# Patient Record
Sex: Male | Born: 1970
Health system: Southern US, Community
[De-identification: ages and names within clinical notes are randomized; demographics above are authoritative.]

## PROBLEM LIST (undated history)

## (undated) DIAGNOSIS — I10 Essential (primary) hypertension: Secondary | ICD-10-CM

---

## 1999-09-03 ENCOUNTER — Encounter: Payer: Self-pay | Admitting: Emergency Medicine

## 1999-09-03 ENCOUNTER — Emergency Department (HOSPITAL_COMMUNITY): Admission: EM | Admit: 1999-09-03 | Discharge: 1999-09-03 | Payer: Self-pay | Admitting: Emergency Medicine

## 2008-08-13 ENCOUNTER — Emergency Department (HOSPITAL_BASED_OUTPATIENT_CLINIC_OR_DEPARTMENT_OTHER): Admission: EM | Admit: 2008-08-13 | Discharge: 2008-08-13 | Payer: Self-pay | Admitting: Emergency Medicine

## 2012-09-19 ENCOUNTER — Encounter (HOSPITAL_BASED_OUTPATIENT_CLINIC_OR_DEPARTMENT_OTHER): Payer: Self-pay | Admitting: *Deleted

## 2012-09-19 ENCOUNTER — Emergency Department (HOSPITAL_BASED_OUTPATIENT_CLINIC_OR_DEPARTMENT_OTHER)
Admission: EM | Admit: 2012-09-19 | Discharge: 2012-09-19 | Disposition: A | Discharge status: Home / Self Care | Payer: Self-pay | Attending: Emergency Medicine | Admitting: Emergency Medicine

## 2012-09-19 ENCOUNTER — Emergency Department (HOSPITAL_BASED_OUTPATIENT_CLINIC_OR_DEPARTMENT_OTHER): Payer: Self-pay

## 2012-09-19 DIAGNOSIS — F17200 Nicotine dependence, unspecified, uncomplicated: Secondary | ICD-10-CM | POA: Insufficient documentation

## 2012-09-19 DIAGNOSIS — R079 Chest pain, unspecified: Secondary | ICD-10-CM | POA: Insufficient documentation

## 2012-09-19 LAB — CBC
HCT: 40.7 % (ref 39.0–52.0)
MCV: 84.6 fL (ref 78.0–100.0)
Platelets: 225 10*3/uL (ref 150–400)
RBC: 4.81 MIL/uL (ref 4.22–5.81)
RDW: 13.6 % (ref 11.5–15.5)
WBC: 7.6 10*3/uL (ref 4.0–10.5)

## 2012-09-19 LAB — URINALYSIS, ROUTINE W REFLEX MICROSCOPIC
Bilirubin Urine: NEGATIVE
Glucose, UA: NEGATIVE mg/dL
Hgb urine dipstick: NEGATIVE
Ketones, ur: NEGATIVE mg/dL
Protein, ur: NEGATIVE mg/dL
Urobilinogen, UA: 0.2 mg/dL (ref 0.0–1.0)

## 2012-09-19 LAB — COMPREHENSIVE METABOLIC PANEL
AST: 23 U/L (ref 0–37)
Albumin: 3.9 g/dL (ref 3.5–5.2)
Alkaline Phosphatase: 55 U/L (ref 39–117)
BUN: 7 mg/dL (ref 6–23)
CO2: 26 mEq/L (ref 19–32)
Chloride: 102 mEq/L (ref 96–112)
Creatinine, Ser: 1 mg/dL (ref 0.50–1.35)
GFR calc non Af Amer: >90 mL/min (ref >90)
Potassium: 4 mEq/L (ref 3.5–5.1)
Total Bilirubin: 0.2 mg/dL — ABNORMAL LOW (ref 0.3–1.2)

## 2012-09-19 NOTE — ED Provider Notes (Signed)
History     CSN: [id number]  Arrival date & time 09/19/12  1106   First MD Initiated Contact with Patient 09/19/12 1151      Chief Complaint  Patient presents with  . Chest Pain    (Consider location/radiation/quality/duration/timing/severity/associated sxs/prior treatment) HPI Comments: Patient presents with a 2 1/2 month history of intermittent discomfort in the center of the chest.  It seems to come and go at random without any exertional component.  The is no radiation to the arm or jaw but he does report some numbness of the hands when the symptoms are at their worst.    Patient is a 41 y.o. male presenting with chest pain. The history is provided by the patient.  Chest Pain Episode onset: 2 1/2 months ago. Chest pain occurs intermittently. The chest pain is unchanged. The pain is associated with stress. The severity of the pain is moderate. The quality of the pain is described as aching. The pain does not radiate. Chest pain is worsened by stress.     History reviewed. No pertinent past medical history.  History reviewed. No pertinent past surgical history.  History reviewed. No pertinent family history.  History  Substance Use Topics  . Smoking status: Current Every Day Smoker -- 1.0 packs/day    Types: Cigarettes  . Smokeless tobacco: Not on file  . Alcohol Use: Yes     remote history of drug and ETOH abuse      Review of Systems  Cardiovascular: Positive for chest pain.  All other systems reviewed and are negative.    Allergies  Review of patient's allergies indicates no known allergies.  Home Medications  No current outpatient prescriptions on file.  BP 122/90  Pulse 79  Temp 98.3 F (36.8 C) (Oral)  Resp 14  SpO2 100%  Physical Exam  Nursing note and vitals reviewed. Constitutional: He is oriented to person, place, and time. He appears well-developed and well-nourished. No distress.  HENT:  Head: Normocephalic and atraumatic.  Mouth/Throat:  Oropharynx is clear and moist.  Neck: Normal range of motion. Neck supple.  Cardiovascular: Normal rate and regular rhythm.   No murmur heard. Pulmonary/Chest: Effort normal and breath sounds normal. No respiratory distress.  Abdominal: Soft. Bowel sounds are normal. He exhibits no distension. There is no tenderness.  Musculoskeletal: Normal range of motion.  Neurological: He is alert and oriented to person, place, and time.  Skin: Skin is warm and dry. He is not diaphoretic.    ED Course  Procedures (including critical care time)   Labs Reviewed  CBC  COMPREHENSIVE METABOLIC PANEL  URINALYSIS, ROUTINE W REFLEX MICROSCOPIC   No results found.   No diagnosis found.   Date: 09/19/2012  Rate: 75  Rhythm: normal sinus rhythm  QRS Axis: normal  Intervals: normal  ST/T Wave abnormalities: nonspecific T wave changes  Conduction Disutrbances:none  Narrative Interpretation:   Old EKG Reviewed: none available    MDM  The symptoms are atypical for a cardiac etiology and the workup is negative.  I will discharge him with follow up information for [facility] to discuss possible stress test.        Geoffery Lyons, MD 09/19/12 1309

## 2012-09-19 NOTE — ED Notes (Signed)
Patient C/O chest pain that has been intermittent for more than a year. States that to day the pain came and has not gone away.  Pain is mid chest with pain in his neck and left shoulder.  Also C?O left hand numbness.  He describes that pain as pressure and is 6/10.  Pressing on his sternum makes the pain a little worse.

## 2014-08-22 ENCOUNTER — Encounter (HOSPITAL_BASED_OUTPATIENT_CLINIC_OR_DEPARTMENT_OTHER): Payer: Self-pay | Admitting: Emergency Medicine

## 2014-08-22 ENCOUNTER — Emergency Department (HOSPITAL_BASED_OUTPATIENT_CLINIC_OR_DEPARTMENT_OTHER)
Admission: EM | Admit: 2014-08-22 | Discharge: 2014-08-22 | Disposition: A | Discharge status: Home / Self Care | Payer: Self-pay | Attending: Emergency Medicine | Admitting: Emergency Medicine

## 2014-08-22 DIAGNOSIS — M5430 Sciatica, unspecified side: Secondary | ICD-10-CM | POA: Insufficient documentation

## 2014-08-22 DIAGNOSIS — M5441 Lumbago with sciatica, right side: Secondary | ICD-10-CM

## 2014-08-22 DIAGNOSIS — F17200 Nicotine dependence, unspecified, uncomplicated: Secondary | ICD-10-CM | POA: Insufficient documentation

## 2014-08-22 DIAGNOSIS — M549 Dorsalgia, unspecified: Secondary | ICD-10-CM | POA: Insufficient documentation

## 2014-08-22 MED ORDER — KETOROLAC TROMETHAMINE 60 MG/2ML IM SOLN
60.0000 mg | Freq: Once | INTRAMUSCULAR | Status: AC
  Administered 2014-08-22: 60 mg via INTRAMUSCULAR
  Filled 2014-08-22: qty 2

## 2014-08-22 MED ORDER — OXYCODONE-ACETAMINOPHEN 5-325 MG PO TABS
1.0000 | ORAL_TABLET | Freq: Once | ORAL | Status: AC
  Administered 2014-08-22: 1 via ORAL
  Filled 2014-08-22: qty 1

## 2014-08-22 MED ORDER — NAPROXEN 500 MG PO TABS: 500.0000 mg | ORAL_TABLET | Freq: Two times a day (BID) | ORAL | Status: DC

## 2014-08-22 MED ORDER — CYCLOBENZAPRINE HCL 10 MG PO TABS
10.0000 mg | ORAL_TABLET | Freq: Once | ORAL | Status: AC
  Administered 2014-08-22: 10 mg via ORAL
  Filled 2014-08-22: qty 1

## 2014-08-22 MED ORDER — HYDROCODONE-ACETAMINOPHEN 5-325 MG PO TABS: 1.0000 | ORAL_TABLET | Freq: Four times a day (QID) | ORAL | Status: DC | PRN

## 2014-08-22 MED ORDER — CYCLOBENZAPRINE HCL 10 MG PO TABS: 10.0000 mg | ORAL_TABLET | Freq: Two times a day (BID) | ORAL | Status: DC | PRN

## 2014-08-22 NOTE — ED Notes (Addendum)
Back and right hip pain. Pain started 3 days ago after picking up a child.

## 2014-08-22 NOTE — ED Provider Notes (Signed)
CSN: [id number]     Arrival date & time 08/22/14  1714 History   First MD Initiated Contact with Patient 08/22/14 1724     Chief Complaint  Patient presents with  . Back Pain     (Consider location/radiation/quality/duration/timing/severity/associated sxs/prior Treatment) Patient is a [age] y.o. male presenting with back pain. The history is provided by the patient.  Back Pain Location:  Lumbar spine Quality:  Stabbing and burning Radiates to:  R posterior upper leg Pain severity:  Severe Pain is:  Same all the time Onset quality:  Gradual Duration:  4 days Timing:  Constant Progression:  Worsening Chronicity:  Recurrent Context: physical stress   Relieved by:  Nothing Worsened by:  Movement and bending Ineffective treatments:  Cold packs, heating pad and ibuprofen Associated symptoms: leg pain   Associated symptoms: no abdominal pain, no bladder incontinence, no bowel incontinence, no chest pain, no dysuria and no fever    Michael Lowery is a 43 y.o. male who presents to the ED with low back pain that started 4 days ago. The pain increases with any movement. He has alternated heat and cold and has taken ibuprofen without relief. The pain started 3 days ago after his lifted his child. The pain radiates to the right buttock.   History reviewed. No pertinent past medical history. History reviewed. No pertinent past surgical history. No family history on file. History  Substance Use Topics  . Smoking status: Current Every Day Smoker -- 1.00 packs/day    Types: Cigarettes  . Smokeless tobacco: Not on file  . Alcohol Use: Yes     Comment: remote history of drug and ETOH abuse    Review of Systems  Constitutional: Negative for fever.  Cardiovascular: Negative for chest pain.  Gastrointestinal: Negative for abdominal pain and bowel incontinence.  Genitourinary: Negative for bladder incontinence and dysuria.  Musculoskeletal: Positive for back pain.  all other systems  negative    Allergies  Review of patient's allergies indicates no known allergies.  Home Medications   Prior to Admission medications   Not on File   BP 130/64  Pulse 95  Temp(Src) 98.5 F (36.9 C) (Oral)  Resp 20  Ht 5\' 9"  (1.753 m)  Wt 165 lb (74.844 kg)  BMI 24.36 kg/m2  SpO2 99% Physical Exam  Nursing note and vitals reviewed. Constitutional: He is oriented to person, place, and time. He appears well-developed and well-nourished. No distress.  HENT:  Head: Normocephalic and atraumatic.  Eyes: EOM are normal. Pupils are equal, round, and reactive to light.  Neck: Normal range of motion. Neck supple.  Cardiovascular: Normal rate and regular rhythm.   Pulmonary/Chest: Effort normal. No respiratory distress. He has no wheezes. He has no rales.  Abdominal: Soft. Bowel sounds are normal. There is no tenderness.  Musculoskeletal: Normal range of motion. He exhibits no edema.       Lumbar back: He exhibits tenderness and spasm. He exhibits normal range of motion, no deformity and normal pulse.       Back:  Neurological: He is alert and oriented to person, place, and time. He has normal strength. No cranial nerve deficit or sensory deficit. Coordination and gait normal.  Reflex Scores:      Bicep reflexes are 2+ on the right side and 2+ on the left side.      Brachioradialis reflexes are 2+ on the right side and 2+ on the left side.      Patellar reflexes are 2+ on the  right side and 2+ on the left side.      Achilles reflexes are 2+ on the right side and 2+ on the left side. Skin: Skin is warm and dry.  Psychiatric: He has a normal mood and affect. His behavior is normal.    ED Course  Procedures   MDM  43 y.o. male with low back pain after lifting a child 3 days ago. Will treat for lumbar strain. Stable for discharge without neuro deficits. Discussed with the patient clinical findings and plan of care and all questioned fully answered. He will return if any problems  arise.    Medication List         cyclobenzaprine 10 MG tablet  Commonly known as:  FLEXERIL  Take 1 tablet (10 mg total) by mouth 2 (two) times daily as needed for muscle spasms.     HYDROcodone-acetaminophen 5-325 MG per tablet  Commonly known as:  NORCO  Take 1 tablet by mouth every 6 (six) hours as needed for moderate pain.     naproxen 500 MG tablet  Commonly known as:  NAPROSYN  Take 1 tablet (500 mg total) by mouth 2 (two) times daily.             [place], [place] 08/23/14 [phone]

## 2014-08-24 NOTE — ED Provider Notes (Signed)
Medical screening examination/treatment/procedure(s) were performed by non-physician practitioner and as supervising physician I was immediately available for consultation/collaboration.   EKG Interpretation None        Audree CamelScott T Julies Carmickle, MD 08/24/14 [phone]

## 2015-05-16 ENCOUNTER — Encounter (HOSPITAL_BASED_OUTPATIENT_CLINIC_OR_DEPARTMENT_OTHER): Payer: Self-pay

## 2015-05-16 ENCOUNTER — Emergency Department (HOSPITAL_BASED_OUTPATIENT_CLINIC_OR_DEPARTMENT_OTHER)
Admission: EM | Admit: 2015-05-16 | Discharge: 2015-05-16 | Discharge status: Against Medical Advice | Payer: Self-pay | Attending: Emergency Medicine | Admitting: Emergency Medicine

## 2015-05-16 ENCOUNTER — Emergency Department (HOSPITAL_BASED_OUTPATIENT_CLINIC_OR_DEPARTMENT_OTHER)
Admission: EM | Admit: 2015-05-16 | Discharge: 2015-05-16 | Disposition: A | Discharge status: Home / Self Care | Payer: Self-pay | Attending: Emergency Medicine | Admitting: Emergency Medicine

## 2015-05-16 DIAGNOSIS — K047 Periapical abscess without sinus: Secondary | ICD-10-CM | POA: Insufficient documentation

## 2015-05-16 DIAGNOSIS — R220 Localized swelling, mass and lump, head: Secondary | ICD-10-CM | POA: Insufficient documentation

## 2015-05-16 DIAGNOSIS — Z720 Tobacco use: Secondary | ICD-10-CM | POA: Insufficient documentation

## 2015-05-16 MED ORDER — CLINDAMYCIN HCL 300 MG PO CAPS: 300.0000 mg | ORAL_CAPSULE | Freq: Four times a day (QID) | ORAL | Status: DC

## 2015-05-16 NOTE — ED Notes (Signed)
Pt on phone-advised pt to let me know when he can end the call and proceed with triage-pt back to ED WR

## 2015-05-16 NOTE — Discharge Instructions (Signed)
Take clindamycin as directed for 1 week. It is important to complete the entire course of the antibiotic. Follow-up with the dentist listed above were one of the resources attached.  Dental Abscess A dental abscess is a collection of infected fluid (pus) from a bacterial infection in the inner part of the tooth (pulp). It usually occurs at the end of the tooth's root.  CAUSES   Severe tooth decay.  Trauma to the tooth that allows bacteria to enter into the pulp, such as a broken or chipped tooth. SYMPTOMS   Severe pain in and around the infected tooth.  Swelling and redness around the abscessed tooth or in the mouth or face.  Tenderness.  Pus drainage.  Bad breath.  Bitter taste in the mouth.  Difficulty swallowing.  Difficulty opening the mouth.  Nausea.  Vomiting.  Chills.  Swollen neck glands. DIAGNOSIS   A medical and dental history will be taken.  An examination will be performed by tapping on the abscessed tooth.  X-rays may be taken of the tooth to identify the abscess. TREATMENT The goal of treatment is to eliminate the infection. You may be prescribed antibiotic medicine to stop the infection from spreading. A root canal may be performed to save the tooth. If the tooth cannot be saved, it may be pulled (extracted) and the abscess may be drained.  HOME CARE INSTRUCTIONS  Only take over-the-counter or prescription medicines for pain, fever, or discomfort as directed by your caregiver.  Rinse your mouth (gargle) often with salt water ( tsp salt in 8 oz [250 ml] of warm water) to relieve pain or swelling.  Do not drive after taking pain medicine (narcotics).  Do not apply heat to the outside of your face.  Return to your dentist for further treatment as directed. SEEK MEDICAL CARE IF:  Your pain is not helped by medicine.  Your pain is getting worse instead of better. SEEK IMMEDIATE MEDICAL CARE IF:  You have a fever or persistent symptoms for more  than 2-3 days.  You have a fever and your symptoms suddenly get worse.  You have chills or a very bad headache.  You have problems breathing or swallowing.  You have trouble opening your mouth.  You have swelling in the neck or around the eye. Document Released: 11/10/2005 Document Revised: 08/04/2012 Document Reviewed: 02/18/2011 [facility] Patient Information 2015 [place], [place]. This information is not intended to replace advice given to you by your health care provider. Make sure you discuss any questions you have with your health care provider.

## 2015-05-16 NOTE — ED Notes (Addendum)
Swelling to left cheek-started last night-pt states he known he has "bad tooth" left lower x "months"-pt was seen by [facility] MD r/t to possible injury yesterday at work yesterday and referred to ED-pt states he used an OTC product "like silly putty that you put down in the tooth" yesterday
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
## 2015-05-16 NOTE — ED Notes (Signed)
Pt appeared to be on the phone with employer questioning whether his swelling was r/t injury at work yesterday vs known "bad tooth"-now advised by registration clerk that pt notified her he was leaving due to his work advised him to be seen at another facility

## 2016-02-20 ENCOUNTER — Encounter (HOSPITAL_BASED_OUTPATIENT_CLINIC_OR_DEPARTMENT_OTHER): Payer: Self-pay | Admitting: Emergency Medicine

## 2016-02-20 ENCOUNTER — Emergency Department (HOSPITAL_BASED_OUTPATIENT_CLINIC_OR_DEPARTMENT_OTHER): Payer: Self-pay

## 2016-02-20 ENCOUNTER — Emergency Department (HOSPITAL_BASED_OUTPATIENT_CLINIC_OR_DEPARTMENT_OTHER)
Admission: EM | Admit: 2016-02-20 | Discharge: 2016-02-20 | Disposition: A | Discharge status: Home / Self Care | Payer: Self-pay | Attending: Emergency Medicine | Admitting: Emergency Medicine

## 2016-02-20 DIAGNOSIS — F17210 Nicotine dependence, cigarettes, uncomplicated: Secondary | ICD-10-CM | POA: Insufficient documentation

## 2016-02-20 DIAGNOSIS — R69 Illness, unspecified: Secondary | ICD-10-CM

## 2016-02-20 DIAGNOSIS — J111 Influenza due to unidentified influenza virus with other respiratory manifestations: Secondary | ICD-10-CM | POA: Insufficient documentation

## 2016-02-20 MED ORDER — ACETAMINOPHEN 325 MG PO TABS
650.0000 mg | ORAL_TABLET | Freq: Once | ORAL | Status: AC | PRN
  Administered 2016-02-20: 650 mg via ORAL
  Filled 2016-02-20: qty 2
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
## 2016-02-20 NOTE — Discharge Instructions (Signed)
Influenza, Adult Take Tylenol as directed every 4 hours while awake for aches or for temperature higher than 100.4. Make sure that you drink at least six 8 ounce glasses of water or Gatorade each day. Call the [facility] and community wellness Center or your local health department to get a primary care physician. See your physician in the office if not improving by next week. Ask your new primary care physician to help you to stop smoking .Return if concerned for any reason. Influenza ("the flu") is a viral infection of the respiratory tract. It occurs more often in winter months because people spend more time in close contact with one another. Influenza can make you feel very sick. Influenza easily spreads from person to person (contagious). CAUSES  Influenza is caused by a virus that infects the respiratory tract. You can catch the virus by breathing in droplets from an infected person's cough or sneeze. You can also catch the virus by touching something that was recently contaminated with the virus and then touching your mouth, nose, or eyes. RISKS AND COMPLICATIONS You may be at risk for a more severe case of influenza if you smoke cigarettes, have diabetes, have chronic heart disease (such as heart failure) or lung disease (such as asthma), or if you have a weakened immune system. Elderly people and pregnant women are also at risk for more serious infections. The most common problem of influenza is a lung infection (pneumonia). Sometimes, this problem can require emergency medical care and may be life threatening. SIGNS AND SYMPTOMS  Symptoms typically last 4 to 10 days and may include:  Fever.  Chills.  Headache, body aches, and muscle aches.  Sore throat.  Chest discomfort and cough.  Poor appetite.  Weakness or feeling tired.  Dizziness.  Nausea or vomiting. DIAGNOSIS  Diagnosis of influenza is often made based on your history and a physical exam. A nose or throat swab test can  be done to confirm the diagnosis. TREATMENT  In mild cases, influenza goes away on its own. Treatment is directed at relieving symptoms. For more severe cases, your health care provider may prescribe antiviral medicines to shorten the sickness. Antibiotic medicines are not effective because the infection is caused by a virus, not by bacteria. HOME CARE INSTRUCTIONS  Take medicines only as directed by your health care provider.  Use a cool mist humidifier to make breathing easier.  Get plenty of rest until your temperature returns to normal. This usually takes 3 to 4 days.  Drink enough fluid to keep your urine clear or pale yellow.  Cover yourmouth and nosewhen coughing or sneezing,and wash your handswellto prevent thevirusfrom spreading.  Stay homefromwork orschool untilthe fever is gonefor at least 27full day. PREVENTION  An annual influenza vaccination (flu shot) is the best way to avoid getting influenza. An annual flu shot is now routinely recommended for all adults in the [place] SEEK MEDICAL CARE IF:  You experiencechest pain, yourcough worsens,or you producemore mucus.  Youhave nausea,vomiting, ordiarrhea.  Your fever returns or gets worse. SEEK IMMEDIATE MEDICAL CARE IF:  You havetrouble breathing, you become short of breath,or your skin ornails becomebluish.  You have severe painor stiffnessin the neck.  You develop a sudden headache, or pain in the face or ear.  You have nausea or vomiting that you cannot control. MAKE SURE YOU:   Understand these instructions.  Will watch your condition.  Will get help right away if you are not doing well or get worse.  This information is not intended to replace advice given to you by your health care provider. Make sure you discuss any questions you have with your health care provider.   Document Released: 11/07/2000 Document Revised: 12/01/2014 Document Reviewed: 02/09/2012 Elsevier Interactive  Patient Education [employer]2016 Elsevier Inc.

## 2016-02-20 NOTE — ED Notes (Signed)
Patient reports that he has had a cough x 2 weeks. Today he started to feel hot and then cold, generalized weakness and fever.

## 2016-02-20 NOTE — ED Notes (Signed)
Pt given d/c instructions as per chart. Verbalizes understanding. No questions. 

## 2016-02-20 NOTE — ED Notes (Signed)
MD at bedside. 

## 2016-02-20 NOTE — ED Notes (Signed)
Pt c/o H/A, body aches, cough since Sunday

## 2017-06-08 ENCOUNTER — Emergency Department (HOSPITAL_BASED_OUTPATIENT_CLINIC_OR_DEPARTMENT_OTHER)
Admission: EM | Admit: 2017-06-08 | Discharge: 2017-06-08 | Disposition: A | Discharge status: Home / Self Care | Payer: BLUE CROSS/BLUE SHIELD | Attending: Emergency Medicine | Admitting: Emergency Medicine

## 2017-06-08 ENCOUNTER — Encounter (HOSPITAL_BASED_OUTPATIENT_CLINIC_OR_DEPARTMENT_OTHER): Payer: Self-pay | Admitting: *Deleted

## 2017-06-08 DIAGNOSIS — S0003XA Contusion of scalp, initial encounter: Secondary | ICD-10-CM

## 2017-06-08 DIAGNOSIS — F17210 Nicotine dependence, cigarettes, uncomplicated: Secondary | ICD-10-CM | POA: Insufficient documentation

## 2017-06-08 DIAGNOSIS — K921 Melena: Secondary | ICD-10-CM

## 2017-06-08 DIAGNOSIS — R42 Dizziness and giddiness: Secondary | ICD-10-CM | POA: Diagnosis not present

## 2017-06-08 DIAGNOSIS — K625 Hemorrhage of anus and rectum: Secondary | ICD-10-CM | POA: Diagnosis not present

## 2017-06-08 LAB — COMPREHENSIVE METABOLIC PANEL
ALK PHOS: 55 U/L (ref 38–126)
ALT: 23 U/L (ref 17–63)
ANION GAP: 9 (ref 5–15)
AST: 21 U/L (ref 15–41)
Albumin: 4.3 g/dL (ref 3.5–5.0)
BUN: 8 mg/dL (ref 6–20)
CALCIUM: 9.5 mg/dL (ref 8.9–10.3)
CHLORIDE: 103 mmol/L (ref 101–111)
CO2: 27 mmol/L (ref 22–32)
CREATININE: 1.06 mg/dL (ref 0.61–1.24)
GFR calc Af Amer: >60 mL/min (ref >60)
Glucose, Bld: 99 mg/dL (ref 65–99)
Potassium: 4.2 mmol/L (ref 3.5–5.1)
SODIUM: 139 mmol/L (ref 135–145)
Total Bilirubin: 0.4 mg/dL (ref 0.3–1.2)
Total Protein: 7.7 g/dL (ref 6.5–8.1)

## 2017-06-08 LAB — CBC
HCT: 42.9 % (ref 39.0–52.0)
HEMOGLOBIN: 15 g/dL (ref 13.0–17.0)
MCH: 29.9 pg (ref 26.0–34.0)
MCHC: 35 g/dL (ref 30.0–36.0)
MCV: 85.6 fL (ref 78.0–100.0)
PLATELETS: 228 10*3/uL (ref 150–400)
RBC: 5.01 MIL/uL (ref 4.22–5.81)
RDW: 14.2 % (ref 11.5–15.5)
WBC: 8.1 10*3/uL (ref 4.0–10.5)

## 2017-06-08 NOTE — ED Triage Notes (Signed)
Blood in his stool this am. Hx of constipation. At work he was dizzy and staggered hitting his head on his work station. 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

## 2017-06-08 NOTE — Discharge Instructions (Signed)
It was our pleasure to provide your ER care today - we hope that you feel better.  Your blood tests negative for blood.   Your lab work is normal.   Sometimes when you wipe you can see some blood from small anal/rectal fissure.   Rest. Drink plenty of fluids.   Follow up with primary care doctor in the coming week if symptoms recur.    Return to ER if worse, new symptoms, fevers, heavy rectal bleeding, fainting, other concern.

## 2017-06-08 NOTE — ED Provider Notes (Addendum)
[facility] Provider Note   CSN: [id number] Arrival date & time: 06/08/17  1447   By signing my name below, I, Soijett Blue, attest that this documentation has been prepared under the direction and in the presence of Cathren Laine, MD. Electronically Signed: Soijett Blue, ED Scribe. 06/08/17. 5:07 PM.  History   Chief Complaint Chief Complaint  Patient presents with  . Rectal Bleeding    HPI Michael Lowery is a 46 y.o. male who presents to the Emergency Department complaining of rectal bleeding onset today. He states that he noticed the blood when he wiped after using the restroom today. Pt reports associated dark-colored stools, lightheadedness x yesterday, blurred vision, dizziness, and hitting his head. He notes that he got dizzy when he accidentally struck the top of his head on a work station. Pt has not tried any medications for the relief of his symptoms. Denies taking anticoagulants or hx of GI ulcers. He denies LOC, vomiting, constipation, hx of hemorrhoids, and any other symptoms.    The history is provided by the patient. No language interpreter was used.    History reviewed. No pertinent past medical history.  There are no active problems to display for this patient.   History reviewed. No pertinent surgical history.     Home Medications    Prior to Admission medications   Medication Sig Start Date End Date Taking? Authorizing Provider  clindamycin (CLEOCIN) 300 MG capsule Take 1 capsule (300 mg total) by mouth 4 (four) times daily. X 7 days 05/16/15   Kathrynn Speed, PA-C    Family History No family history on file.  Social History Social History  Substance Use Topics  . Smoking status: Current Every Day Smoker    Packs/day: 1.00    Types: Cigarettes  . Smokeless tobacco: Never Used  . Alcohol use No     Allergies   Patient has no known allergies.   Review of Systems Review of Systems  Gastrointestinal: Positive for blood in stool  and hematochezia. Negative for constipation and vomiting.  Neurological: Positive for dizziness and light-headedness. Negative for syncope.     Physical Exam Updated Vital Signs BP (!) 143/93 (BP Location: Left Arm)   Pulse 82   Temp 98.7 F (37.1 C) (Oral)   Resp 18   Ht 5\' 9"  (1.753 m)   Wt 175 lb (79.4 kg)   SpO2 100%   BMI 25.84 kg/m   Physical Exam  Constitutional: He is oriented to person, place, and time. He appears well-developed and well-nourished. No distress.  HENT:  Head: Normocephalic and atraumatic.  Mouth/Throat: Oropharynx is clear and moist.  Eyes: Pupils are equal, round, and reactive to light. EOM are normal.  Neck: Neck supple. No thyromegaly present.  Cardiovascular: Normal rate, regular rhythm, normal heart sounds and intact distal pulses.  Exam reveals no gallop and no friction rub.   No murmur heard. Pulmonary/Chest: Effort normal and breath sounds normal. No respiratory distress. He has no wheezes. He has no rales.  Abdominal: Soft. He exhibits no distension and no mass. There is no tenderness. There is no rebound and no guarding. No hernia.  Genitourinary: Rectum normal. Rectal exam shows no external hemorrhoid and no internal hemorrhoid.  Genitourinary Comments: Scribe chaperone present for exam. Brown stool in vault. No gross blood noted.  Musculoskeletal: Normal range of motion.  c spine non tender, aligned.   Neurological: He is alert and oriented to person, place, and time.  Motor intact bil, stre  5/5. Steady gait.   Skin: Skin is warm and dry.  Psychiatric: He has a normal mood and affect. His behavior is normal.  Nursing note and vitals reviewed.    ED Treatments / Results  DIAGNOSTIC STUDIES: Oxygen Saturation is 100% on RA, nl by my interpretation.    COORDINATION OF CARE: 5:06 PM Discussed treatment plan with pt at bedside and pt agreed to plan.   Labs (all labs ordered are listed, but only abnormal results are displayed) Results  for orders placed or performed during the hospital encounter of 06/08/17  Comprehensive metabolic panel  Result Value Ref Range   Sodium 139 135 - 145 mmol/L   Potassium 4.2 3.5 - 5.1 mmol/L   Chloride 103 101 - 111 mmol/L   CO2 27 22 - 32 mmol/L   Glucose, Bld 99 65 - 99 mg/dL   BUN 8 6 - 20 mg/dL   Creatinine, Ser 1.611.06 0.61 - 1.24 mg/dL   Calcium 9.5 8.9 - 09.610.3 mg/dL   Total Protein 7.7 6.5 - 8.1 g/dL   Albumin 4.3 3.5 - 5.0 g/dL   AST 21 15 - 41 U/L   ALT 23 17 - 63 U/L   Alkaline Phosphatase 55 38 - 126 U/L   Total Bilirubin 0.4 0.3 - 1.2 mg/dL   GFR calc non Af Amer >60 >60 mL/min   GFR calc Af Amer >60 >60 mL/min   Anion gap 9 5 - 15  CBC  Result Value Ref Range   WBC 8.1 4.0 - 10.5 K/uL   RBC 5.01 4.22 - 5.81 MIL/uL   Hemoglobin 15.0 13.0 - 17.0 g/dL   HCT 04.542.9 40.939.0 - 81.152.0 %   MCV 85.6 78.0 - 100.0 fL   MCH 29.9 26.0 - 34.0 pg   MCHC 35.0 30.0 - 36.0 g/dL   RDW 91.414.2 78.211.5 - 95.615.5 %   Platelets 228 150 - 400 K/uL    EKG  EKG Interpretation None       Radiology No results found.  Procedures Procedures (including critical care time)  Medications Ordered in ED Medications - No data to display   Initial Impression / Assessment and Plan / ED Course  I have reviewed the triage vital signs and the nursing notes.  Pertinent labs & imaging results that were available during my care of the patient were reviewed by me and considered in my medical decision making (see chart for details).  Labs sent.  Rectal exam negative, lightbrown/yellow stool.  Pt had noted small amt blood on toilet paper when wiped, no other rectal bleeding, no melena.  ?tiny anal fissure.   No abd pain currently. abd soft nt.   Labs normal.  Patient appears stable for d/c.     Final Clinical Impressions(s) / ED Diagnoses   Final diagnoses:  None    New Prescriptions New Prescriptions   No medications on file  I personally performed the services described in this documentation,  which was scribed in my presence. The recorded information has been reviewed and considered. Cathren LaineSteinl, Maleyah Evans, MD       Cathren LaineSteinl, Blen Ransome, MD 06/08/17 [phone]

## 2018-05-25 ENCOUNTER — Emergency Department (HOSPITAL_BASED_OUTPATIENT_CLINIC_OR_DEPARTMENT_OTHER)
Admission: EM | Admit: 2018-05-25 | Discharge: 2018-05-25 | Disposition: A | Discharge status: Home / Self Care | Payer: BLUE CROSS/BLUE SHIELD | Attending: Emergency Medicine | Admitting: Emergency Medicine

## 2018-05-25 ENCOUNTER — Encounter (HOSPITAL_BASED_OUTPATIENT_CLINIC_OR_DEPARTMENT_OTHER): Payer: Self-pay | Admitting: *Deleted

## 2018-05-25 ENCOUNTER — Other Ambulatory Visit: Payer: Self-pay

## 2018-05-25 DIAGNOSIS — M25512 Pain in left shoulder: Secondary | ICD-10-CM | POA: Diagnosis not present

## 2018-05-25 DIAGNOSIS — R42 Dizziness and giddiness: Secondary | ICD-10-CM | POA: Insufficient documentation

## 2018-05-25 DIAGNOSIS — R8290 Unspecified abnormal findings in urine: Secondary | ICD-10-CM | POA: Diagnosis not present

## 2018-05-25 DIAGNOSIS — M25511 Pain in right shoulder: Secondary | ICD-10-CM | POA: Insufficient documentation

## 2018-05-25 DIAGNOSIS — M791 Myalgia, unspecified site: Secondary | ICD-10-CM | POA: Diagnosis present

## 2018-05-25 DIAGNOSIS — M25552 Pain in left hip: Secondary | ICD-10-CM | POA: Diagnosis not present

## 2018-05-25 DIAGNOSIS — F17210 Nicotine dependence, cigarettes, uncomplicated: Secondary | ICD-10-CM | POA: Diagnosis not present

## 2018-05-25 DIAGNOSIS — M25551 Pain in right hip: Secondary | ICD-10-CM | POA: Diagnosis not present

## 2018-05-25 LAB — COMPREHENSIVE METABOLIC PANEL
ALBUMIN: 4.2 g/dL (ref 3.5–5.0)
ALK PHOS: 48 U/L (ref 38–126)
ALT: 20 U/L (ref 0–44)
ANION GAP: 8 (ref 5–15)
AST: 28 U/L (ref 15–41)
BUN: 9 mg/dL (ref 6–20)
CALCIUM: 9 mg/dL (ref 8.9–10.3)
CHLORIDE: 106 mmol/L (ref 98–111)
CO2: 25 mmol/L (ref 22–32)
Creatinine, Ser: 0.95 mg/dL (ref 0.61–1.24)
GFR calc non Af Amer: >60 mL/min (ref >60)
GLUCOSE: 87 mg/dL (ref 70–99)
POTASSIUM: 4.3 mmol/L (ref 3.5–5.1)
SODIUM: 139 mmol/L (ref 135–145)
Total Bilirubin: 0.5 mg/dL (ref 0.3–1.2)
Total Protein: 7.3 g/dL (ref 6.5–8.1)

## 2018-05-25 LAB — CBC WITH DIFFERENTIAL/PLATELET
BASOS PCT: 0 %
Basophils Absolute: 0 10*3/uL (ref 0.0–0.1)
Eosinophils Absolute: 0.3 10*3/uL (ref 0.0–0.7)
Eosinophils Relative: 5 %
HEMATOCRIT: 42.7 % (ref 39.0–52.0)
HEMOGLOBIN: 15 g/dL (ref 13.0–17.0)
LYMPHS ABS: 2.5 10*3/uL (ref 0.7–4.0)
LYMPHS PCT: 37 %
MCH: 29.9 pg (ref 26.0–34.0)
MCHC: 35.1 g/dL (ref 30.0–36.0)
MCV: 85.2 fL (ref 78.0–100.0)
MONO ABS: 0.9 10*3/uL (ref 0.1–1.0)
MONOS PCT: 13 %
NEUTROS PCT: 45 %
Neutro Abs: 3 10*3/uL (ref 1.7–7.7)
Platelets: 224 10*3/uL (ref 150–400)
RBC: 5.01 MIL/uL (ref 4.22–5.81)
RDW: 13.5 % (ref 11.5–15.5)
WBC: 6.7 10*3/uL (ref 4.0–10.5)

## 2018-05-25 LAB — TROPONIN I: Troponin I: <0.03 ng/mL (ref <0.03)

## 2018-05-25 LAB — CK: Total CK: 846 U/L — ABNORMAL HIGH (ref 49–397)

## 2018-05-25 MED ORDER — KETOROLAC TROMETHAMINE 15 MG/ML IJ SOLN
15.0000 mg | Freq: Once | INTRAMUSCULAR | Status: AC
  Administered 2018-05-25: 15 mg via INTRAVENOUS
  Filled 2018-05-25: qty 1

## 2018-05-25 MED ORDER — MELOXICAM 15 MG PO TABS: 7.5000 mg | ORAL_TABLET | Freq: Every day | ORAL | 0 refills | Status: DC

## 2018-05-25 MED FILL — MELOXICAM 15 MG TABLET: 15 | 14 days supply | Qty: 14 | Fill #0

## 2018-05-25 NOTE — ED Provider Notes (Signed)
[facility] EMERGENCY DEPARTMENT Provider Note   CSN: [id number] Arrival date & time: 05/25/18  [id number]     History   Chief Complaint Chief Complaint  Patient presents with  . Muscle Pain    HPI Michael Lowery is a 47 y.o. male.  The history is provided by the patient. No language interpreter was used.  Muscle Pain    Michael Lowery is a 47 y.o. male who presents to the Emergency Department complaining of body aches. He reports one month of muscular aching pain and bilateral shoulders as well as bilateral hips. Pain is worse at night and improves a little bit with activity at work. He describes pain as a muscle type pain with a punching sensation. He is been treating himself with ibuprofen at home with minimal improvement in his symptoms. He is physically active for work with heavy lifting. He did have an episode of central chest pain over the last month, currently resolved. He does endorse a darker urine. He denies any fevers, night sweats, weight loss, shortness of breath, nausea, vomiting, diarrhea. He does get dizzy at times when he stands. He has no known medical problems. He does smoke cigarettes. Symptoms are moderate and constant in nature. History reviewed. No pertinent past medical history.  There are no active problems to display for this patient.   History reviewed. No pertinent surgical history.      Home Medications    Prior to Admission medications   Medication Sig Start Date End Date Taking? Authorizing Provider  meloxicam (MOBIC) 15 MG tablet Take 0.5-1 tablets (7.5-15 mg total) by mouth daily. 05/25/18   Tilden Fossaees, Genesis Paget, MD    Family History History reviewed. No pertinent family history.  Social History Social History   Tobacco Use  . Smoking status: Current Every Day Smoker    Packs/day: 1.00    Types: Cigarettes  . Smokeless tobacco: Never Used  Substance Use Topics  . Alcohol use: No  . Drug use: No     Allergies   Patient has no  known allergies.   Review of Systems Review of Systems  All other systems reviewed and are negative.    Physical Exam Updated Vital Signs BP 129/83 (BP Location: Right Arm)   Pulse 78   Temp 98.3 F (36.8 C) (Oral)   Resp 18   Ht 5\' 9"  (1.753 m)   Wt 77.1 kg (170 lb)   SpO2 99%   BMI 25.10 kg/m   Physical Exam  Constitutional: He is oriented to person, place, and time. He appears well-developed and well-nourished.  HENT:  Head: Normocephalic and atraumatic.  Cardiovascular: Normal rate and regular rhythm.  No murmur heard. Pulmonary/Chest: Effort normal and breath sounds normal. No respiratory distress.  Abdominal: Soft. There is no tenderness. There is no rebound and no guarding.  Musculoskeletal: He exhibits no edema or tenderness.  2+ radial pulses bilaterally. Full range of motion intact and bilateral upper extremities and shoulders. Normal muscle tone and bulk.  Neurological: He is alert and oriented to person, place, and time.  Five out of five strength in all four extremities with sensation to light touch intact in all four extremities. Normal gait.  Skin: Skin is warm and dry.  Psychiatric: He has a normal mood and affect. His behavior is normal.  Nursing note and vitals reviewed.    ED Treatments / Results  Labs (all labs ordered are listed, but only abnormal results are displayed) Labs Reviewed  CK - Abnormal; Notable  for the following components:      Result Value   Total CK 846 (*)    All other components within normal limits  COMPREHENSIVE METABOLIC PANEL  CBC WITH DIFFERENTIAL/PLATELET  TROPONIN I    EKG EKG Interpretation  Date/Time:  Tuesday May 25 2018 08:55:52 EDT Ventricular Rate:  68 PR Interval:    QRS Duration: 94 QT Interval:  389 QTC Calculation: 414 R Axis:   95 Text Interpretation:  Sinus rhythm Atrial premature complex Borderline right axis deviation No significant change since last tracing Confirmed by Tilden Fossa [phone])  on 05/25/2018 9:03:48 AM   Radiology No results found.  Procedures Procedures (including critical care time)  Medications Ordered in ED Medications  ketorolac (TORADOL) 15 MG/ML injection 15 mg (has no administration in time range)     Initial Impression / Assessment and Plan / ED Course  I have reviewed the triage vital signs and the nursing notes.  Pertinent labs & imaging results that were available during my care of the patient were reviewed by me and considered in my medical decision making (see chart for details).    Patient here for evaluation of one month of body aches over the shoulders and hips. He is neurovascular intact on examination. CK is minimally elevated. No evidence of rhabdomyolysis or significant electrolyte abnormality. Discussed with patient likely body aches elated to overuse. Discussed rest oral fluid hydration as well as anti-inflammatories. Will prescribed meloxicam. Discussed discontinuing ibuprofen while taking meloxicam. Discussed importance of PCP follow-up for further evaluation.  Final Clinical Impressions(s) / ED Diagnoses   Final diagnoses:  Myalgia    ED Discharge Orders        Ordered    meloxicam (MOBIC) 15 MG tablet  Daily     05/25/18 0946       Tilden Fossa, MD 05/25/18 [phone]

## 2018-05-25 NOTE — Discharge Instructions (Addendum)
Your muscle enzyme (Creatinine Kinase) was slightly elevated today.  Please follow up with your doctor in the next week for recheck.  Drink plenty of fluids.  You can take tylenol, available over the counter according to label instructions as needed for pain.

## 2018-05-25 NOTE — ED Triage Notes (Signed)
Pt reports "pain all over" in his muscles x over one month. 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

## 2019-07-29 ENCOUNTER — Encounter (HOSPITAL_BASED_OUTPATIENT_CLINIC_OR_DEPARTMENT_OTHER): Payer: Self-pay | Admitting: *Deleted

## 2019-07-29 ENCOUNTER — Other Ambulatory Visit: Payer: Self-pay

## 2019-07-29 ENCOUNTER — Emergency Department (HOSPITAL_BASED_OUTPATIENT_CLINIC_OR_DEPARTMENT_OTHER): Payer: BC Managed Care – PPO

## 2019-07-29 ENCOUNTER — Emergency Department (HOSPITAL_BASED_OUTPATIENT_CLINIC_OR_DEPARTMENT_OTHER)
Admission: EM | Admit: 2019-07-29 | Discharge: 2019-07-29 | Disposition: A | Discharge status: Home / Self Care | Payer: BC Managed Care – PPO | Attending: Emergency Medicine | Admitting: Emergency Medicine

## 2019-07-29 DIAGNOSIS — R079 Chest pain, unspecified: Secondary | ICD-10-CM | POA: Diagnosis not present

## 2019-07-29 DIAGNOSIS — F17210 Nicotine dependence, cigarettes, uncomplicated: Secondary | ICD-10-CM | POA: Insufficient documentation

## 2019-07-29 DIAGNOSIS — R42 Dizziness and giddiness: Secondary | ICD-10-CM | POA: Insufficient documentation

## 2019-07-29 DIAGNOSIS — R51 Headache: Secondary | ICD-10-CM | POA: Diagnosis not present

## 2019-07-29 DIAGNOSIS — I10 Essential (primary) hypertension: Secondary | ICD-10-CM | POA: Insufficient documentation

## 2019-07-29 LAB — BASIC METABOLIC PANEL
Anion gap: 9 (ref 5–15)
BUN: 7 mg/dL (ref 6–20)
CO2: 23 mmol/L (ref 22–32)
Calcium: 9.1 mg/dL (ref 8.9–10.3)
Chloride: 105 mmol/L (ref 98–111)
Creatinine, Ser: 0.97 mg/dL (ref 0.61–1.24)
GFR calc Af Amer: >60 mL/min (ref >60)
GFR calc non Af Amer: >60 mL/min (ref >60)
Glucose, Bld: 103 mg/dL — ABNORMAL HIGH (ref 70–99)
Potassium: 3.8 mmol/L (ref 3.5–5.1)
Sodium: 137 mmol/L (ref 135–145)

## 2019-07-29 LAB — CBC
HCT: 43.9 % (ref 39.0–52.0)
Hemoglobin: 14.4 g/dL (ref 13.0–17.0)
MCH: 29.6 pg (ref 26.0–34.0)
MCHC: 32.8 g/dL (ref 30.0–36.0)
MCV: 90.1 fL (ref 80.0–100.0)
Platelets: 212 10*3/uL (ref 150–400)
RBC: 4.87 MIL/uL (ref 4.22–5.81)
RDW: 14.1 % (ref 11.5–15.5)
WBC: 7.3 10*3/uL (ref 4.0–10.5)
nRBC: 0 % (ref 0.0–0.2)

## 2019-07-29 LAB — TROPONIN I (HIGH SENSITIVITY): Troponin I (High Sensitivity): 5 ng/L (ref <18)

## 2019-07-29 NOTE — ED Notes (Signed)
Patient transported to X-ray 

## 2019-07-29 NOTE — ED Provider Notes (Signed)
[facility] Emergency Department Provider Note MRN:  [id number]  Arrival date & time: 07/29/19     Chief Complaint   Hypertension   History of Present Illness   Michael Lowery is a 48 y.o. year-old male with no pertinent past medical history presenting to the ED with chief complaint of hypertension.  Patient has "felt funny" for the past 2 or 3 days.  Endorsing a dull frontal headache, gradual onset.  Endorsing some lightheadedness.  Also having some "weird feelings" in his chest.  Denies shortness of breath, no fever, no cough, no sore throat, no sick contacts, no abdominal pain, no hematuria.  Had his blood pressure measured 3 times a day, all elevated with systolics in the 154M.  Review of Systems  A complete 10 system review of systems was obtained and all systems are negative except as noted in the HPI and PMH.   Patient's Health History   History reviewed. No pertinent past medical history.  History reviewed. No pertinent surgical history.  No family history on file.  Social History   Socioeconomic History  . Marital status: Married    Spouse name: Not on file  . Number of children: Not on file  . Years of education: Not on file  . Highest education level: Not on file  Occupational History  . Not on file  Social Needs  . Financial resource strain: Not on file  . Food insecurity    Worry: Not on file    Inability: Not on file  . Transportation needs    Medical: Not on file    Non-medical: Not on file  Tobacco Use  . Smoking status: Current Every Day Smoker    Packs/day: 1.00    Types: Cigarettes  . Smokeless tobacco: Never Used  Substance and Sexual Activity  . Alcohol use: No  . Drug use: No  . Sexual activity: Not on file  Lifestyle  . Physical activity    Days per week: Not on file    Minutes per session: Not on file  . Stress: Not on file  Relationships  . Social Herbalist on phone: Not on file    Gets  together: Not on file    Attends religious service: Not on file    Active member of club or organization: Not on file    Attends meetings of clubs or organizations: Not on file    Relationship status: Not on file  . Intimate partner violence    Fear of current or ex partner: Not on file    Emotionally abused: Not on file    Physically abused: Not on file    Forced sexual activity: Not on file  Other Topics Concern  . Not on file  Social History Narrative  . Not on file     Physical Exam  Vital Signs and Nursing Notes reviewed Vitals:   07/29/19 1238 07/29/19 1251  BP:  118/88  Pulse: 73 74  Resp: 16 13  Temp:    SpO2: 99% 100%    CONSTITUTIONAL: Well-appearing, NAD NEURO:  Alert and oriented x 3, normal and symmetric strength and sensation, normal coordination, normal speech EYES:  eyes equal and reactive ENT/NECK:  no LAD, no JVD CARDIO: Regular rate, well-perfused, normal S1 and S2 PULM:  CTAB no wheezing or rhonchi GI/GU:  normal bowel sounds, non-distended, non-tender MSK/SPINE:  No gross deformities, no edema SKIN:  no rash, atraumatic PSYCH:  Appropriate speech and behavior  Diagnostic and Interventional Summary    EKG Interpretation  Date/Time:  Friday July 29 2019 12:33:23 EDT Ventricular Rate:  74 PR Interval:    QRS Duration: 78 QT Interval:  371 QTC Calculation: 412 R Axis:   88 Text Interpretation:  Sinus rhythm Probable left atrial enlargement Borderline T wave abnormalities Baseline wander in lead(s) V3 Confirmed by Kennis CarinaBero, Erryn Dickison [phone]) on 07/29/2019 12:36:18 PM      Labs Reviewed  BASIC METABOLIC PANEL - Abnormal; Notable for the following components:      Result Value   Glucose, Bld 103 (*)    All other components within normal limits  CBC  TROPONIN I (HIGH SENSITIVITY)    DG Chest 2 View  Final Result      Medications - No data to display   Procedures Critical Care  ED Course and Medical Decision Making  I have reviewed the  triage vital signs and the nursing notes.  Pertinent labs & imaging results that were available during my care of the patient were reviewed by me and considered in my medical decision making (see below for details).  Report of hypertension at home with vague mild symptoms, no known history of hypertension, not on any medications.  Normotensive here, all vital signs within normal limits, well-appearing with normal neurological exam, no meningismus.  Will screen for reduced kidney function, endorgan damage, anticipating discharge with primary care follow-up for blood pressure management.  Currently without indication for CNS imaging.  Work-up is reassuring, troponin negative, blood pressures normal during his ED visit here.  He is well-appearing, in general feeling well.  Appropriate for discharge with PCP follow-up.  Elmer SowMichael M. Pilar PlateBero, MD [facility] [email or web address]  Final Clinical Impressions(s) / ED Diagnoses     ICD-10-CM   1. Hypertension, unspecified type  I10   2. Chest pain  R07.9 DG Chest 2 View    DG Chest 2 View    ED Discharge Orders    None      Discharge Instructions Discussed with and Provided to Patient:   Discharge Instructions     You were evaluated in the Emergency Department and after careful evaluation, we did not find any emergent condition requiring admission or further testing in the hospital.  Your exam/testing today was overall reassuring.  We recommend follow-up with your primary care doctor to further discuss your blood pressure.  Please return to the Emergency Department if you experience any worsening of your condition.  We encourage you to follow up with a primary care provider.  Thank you for allowing us to be a part of your care.        Sabas SousBero, Suhaylah Wampole M, MD 07/29/19 1359

## 2019-07-29 NOTE — Discharge Instructions (Signed)
You were evaluated in the Emergency Department and after careful evaluation, we did not find any emergent condition requiring admission or further testing in the hospital.  Your exam/testing today was overall reassuring.  We recommend follow-up with your primary care doctor to further discuss your blood pressure.  Please return to the Emergency Department if you experience any worsening of your condition.  We encourage you to follow up with a primary care provider.  Thank you for allowing [place] to be a part of your care.

## 2019-07-29 NOTE — ED Triage Notes (Signed)
HTN and headache. No hx of HTN.

## 2021-08-12 ENCOUNTER — Other Ambulatory Visit: Payer: Self-pay

## 2021-08-12 ENCOUNTER — Encounter (HOSPITAL_BASED_OUTPATIENT_CLINIC_OR_DEPARTMENT_OTHER): Payer: Self-pay | Admitting: Pharmacy Technician

## 2021-08-12 ENCOUNTER — Emergency Department (HOSPITAL_BASED_OUTPATIENT_CLINIC_OR_DEPARTMENT_OTHER)
Admission: EM | Admit: 2021-08-12 | Discharge: 2021-08-12 | Disposition: A | Discharge status: Home / Self Care | Payer: BLUE CROSS/BLUE SHIELD | Attending: Emergency Medicine | Admitting: Emergency Medicine

## 2021-08-12 DIAGNOSIS — F17210 Nicotine dependence, cigarettes, uncomplicated: Secondary | ICD-10-CM | POA: Diagnosis not present

## 2021-08-12 DIAGNOSIS — Z20822 Contact with and (suspected) exposure to covid-19: Secondary | ICD-10-CM | POA: Diagnosis not present

## 2021-08-12 DIAGNOSIS — I10 Essential (primary) hypertension: Secondary | ICD-10-CM | POA: Insufficient documentation

## 2021-08-12 DIAGNOSIS — R519 Headache, unspecified: Secondary | ICD-10-CM | POA: Diagnosis present

## 2021-08-12 DIAGNOSIS — J069 Acute upper respiratory infection, unspecified: Secondary | ICD-10-CM | POA: Insufficient documentation

## 2021-08-12 HISTORY — DX: Essential (primary) hypertension: I10

## 2021-08-12 LAB — RESP PANEL BY RT-PCR (FLU A&B, COVID) ARPGX2
Influenza A by PCR: NEGATIVE
Influenza B by PCR: NEGATIVE
SARS Coronavirus 2 by RT PCR: NEGATIVE

## 2021-08-12 MED ORDER — ONDANSETRON 4 MG PO TBDP: 4.0000 mg | ORAL_TABLET | Freq: Three times a day (TID) | ORAL | 0 refills | Status: DC | PRN

## 2021-08-12 MED ORDER — BENZONATATE 100 MG PO CAPS: 100.0000 mg | ORAL_CAPSULE | Freq: Three times a day (TID) | ORAL | 0 refills | Status: DC

## 2021-08-12 NOTE — ED Provider Notes (Signed)
[facility] EMERGENCY DEPARTMENT Provider Note   CSN: [id number] Arrival date & time: 08/12/21  4917     History No chief complaint on file.   Michael Lowery is a 50 y.o. male with a past medical history significant for hypertension who presents to the ED due to sudden onset of headache, sore throat, and sneezing that started earlier this morning.  Patient's children are both sick with similar symptoms.  Patient is vaccinated against COVID-19 however, no booster shot.  No fever.  He endorses chills.  Denies abdominal pain, nausea, vomiting, and diarrhea.  Denies chest pain and shortness of breath.  Patient states he feels like he is coming down with a "cold".  No sore throat.  No treatment prior to arrival.  No aggravating or alleviating factors.  No sick contacts or known COVID exposures.  History obtained from patient and past medical records. No interpreter used during encounter.       Past Medical History:  Diagnosis Date   Hypertension     There are no problems to display for this patient.   History reviewed. No pertinent surgical history.     No family history on file.  Social History   Tobacco Use   Smoking status: Every Day    Packs/day: 1.00    Types: Cigarettes   Smokeless tobacco: Never  Substance Use Topics   Alcohol use: No   Drug use: No    Home Medications Prior to Admission medications   Medication Sig Start Date End Date Taking? Authorizing Provider  benzonatate (TESSALON) 100 MG capsule Take 1 capsule (100 mg total) by mouth every 8 (eight) hours. 08/12/21  Yes Shineka Auble C, PA-C  ondansetron (ZOFRAN ODT) 4 MG disintegrating tablet Take 1 tablet (4 mg total) by mouth every 8 (eight) hours as needed for nausea or vomiting. 08/12/21  Yes Saray Capasso, Merla Riches, PA-C  meloxicam (MOBIC) 15 MG tablet Take 0.5-1 tablets (7.5-15 mg total) by mouth daily. 05/25/18   Tilden Fossa, MD    Allergies    Patient has no known  allergies.  Review of Systems   Review of Systems  Constitutional:  Positive for chills. Negative for fever.  HENT:  Positive for sneezing. Negative for sore throat and trouble swallowing.   Respiratory:  Negative for cough and shortness of breath.   Cardiovascular:  Negative for chest pain.  Gastrointestinal:  Negative for abdominal pain, diarrhea, nausea and vomiting.  Neurological:  Positive for headaches.  All other systems reviewed and are negative.  Physical Exam Updated Vital Signs BP (!) 124/92 (BP Location: Left Arm)   Pulse 70   Temp 98.6 F (37 C) (Oral)   Resp 20   SpO2 100%   Physical Exam Vitals and nursing note reviewed.  Constitutional:      General: He is not in acute distress.    Appearance: He is not ill-appearing.  HENT:     Head: Normocephalic.     Mouth/Throat:     Comments: Posterior oropharynx clear and mucous membranes moist, there is mild erythema but no edema or tonsillar exudates, uvula midline, normal phonation, no trismus, tolerating secretions without difficulty. Eyes:     Pupils: Pupils are equal, round, and reactive to light.  Neck:     Comments: No meningismus. Cardiovascular:     Rate and Rhythm: Normal rate and regular rhythm.     Pulses: Normal pulses.     Heart sounds: Normal heart sounds. No murmur heard.   No friction  rub. No gallop.  Pulmonary:     Effort: Pulmonary effort is normal.     Breath sounds: Normal breath sounds.     Comments: Respirations equal and unlabored, patient able to speak in full sentences, lungs clear to auscultation bilaterally Abdominal:     General: Abdomen is flat. There is no distension.     Palpations: Abdomen is soft.     Tenderness: There is no abdominal tenderness. There is no guarding or rebound.     Comments: Abdomen soft, nondistended, nontender to palpation in all quadrants without guarding or peritoneal signs. No rebound.   Musculoskeletal:        General: Normal range of motion.      Cervical back: Neck supple.  Skin:    General: Skin is warm and dry.  Neurological:     General: No focal deficit present.     Mental Status: He is alert.  Psychiatric:        Mood and Affect: Mood normal.        Behavior: Behavior normal.    ED Results / Procedures / Treatments   Labs (all labs ordered are listed, but only abnormal results are displayed) Labs Reviewed  RESP PANEL BY RT-PCR (FLU A&B, COVID) ARPGX2    EKG None  Radiology No results found.  Procedures Procedures   Medications Ordered in ED Medications - No data to display  ED Course  I have reviewed the triage vital signs and the nursing notes.  Pertinent labs & imaging results that were available during my care of the patient were reviewed by me and considered in my medical decision making (see chart for details).    MDM Rules/Calculators/A&P                          50 year old male presents the ED due to sneezing, headaches, and chills that started earlier this morning.  Children also sick with similar symptoms.  No COVID exposures.  Patient is vaccinated against COVID-19.  No chest pain or shortness of breath.  Upon arrival, patient afebrile, not tachycardic or hypoxic.  Patient in no acute distress.  Reassuring physical exam.  No meningismus to suggest meningitis.  Mild erythema to throat.  No exudates or tonsillar hypertrophy.  No abscess.  Lungs clear to auscultation bilaterally.  Low suspicion for pneumonia.  Suspect viral etiology.  COVID/influenza negative.  Discussed with patient symptomatic treatment. Discharged with Tessalon and zofran if he develops cough and nausea. Strict ED precautions discussed with patient. Patient states understanding and agrees to plan. Patient discharged home in no acute distress and stable vitals.  Final Clinical Impression(s) / ED Diagnoses Final diagnoses:  Upper respiratory tract infection, unspecified type    Rx / DC Orders ED Discharge Orders          Ordered     benzonatate (TESSALON) 100 MG capsule  Every 8 hours        08/12/21 1228    ondansetron (ZOFRAN ODT) 4 MG disintegrating tablet  Every 8 hours PRN        08/12/21 1228             Mannie Stabile, PA-C 08/12/21 1308    Gloris Manchester, MD 08/12/21 [phone]

## 2021-08-12 NOTE — Discharge Instructions (Addendum)
It was a pleasure taking care of you today.  As discussed, I suspect you have a viral infection.  Your COVID and influenza test were negative.  I am sending you home with cough medication and nausea medication.  Take as needed.  You may also take over-the-counter cold medication.  Please follow-up with PCP if symptoms not improve over the next week.  Return to the ER for new or worsening symptoms.

## 2021-08-12 NOTE — ED Triage Notes (Signed)
Pt here with reports of headache, sneezing, sore throat and possible fever. Reports whole family.

## 2021-08-26 ENCOUNTER — Other Ambulatory Visit: Payer: Self-pay | Admitting: *Deleted

## 2021-08-26 DIAGNOSIS — M62830 Muscle spasm of back: Secondary | ICD-10-CM

## 2021-09-03 ENCOUNTER — Other Ambulatory Visit: Payer: Self-pay

## 2021-09-03 ENCOUNTER — Ambulatory Visit
Admission: RE | Admit: 2021-09-03 | Discharge: 2021-09-03 | Disposition: A | Discharge status: Home / Self Care | Payer: Worker's Compensation | Source: Ambulatory Visit | Attending: *Deleted | Admitting: *Deleted

## 2021-09-03 DIAGNOSIS — M62830 Muscle spasm of back: Secondary | ICD-10-CM

## 2021-09-24 ENCOUNTER — Ambulatory Visit (INDEPENDENT_AMBULATORY_CARE_PROVIDER_SITE_OTHER): Payer: Worker's Compensation | Admitting: Orthopaedic Surgery

## 2021-09-24 ENCOUNTER — Other Ambulatory Visit: Payer: Self-pay

## 2021-09-24 ENCOUNTER — Encounter (HOSPITAL_BASED_OUTPATIENT_CLINIC_OR_DEPARTMENT_OTHER): Payer: Self-pay | Admitting: Pharmacy Technician

## 2021-09-24 ENCOUNTER — Encounter: Payer: Self-pay | Admitting: Orthopaedic Surgery

## 2021-09-24 DIAGNOSIS — S39012A Strain of muscle, fascia and tendon of lower back, initial encounter: Secondary | ICD-10-CM | POA: Diagnosis not present

## 2021-09-24 MED ORDER — METHOCARBAMOL 500 MG PO TABS: 500.0000 mg | ORAL_TABLET | Freq: Three times a day (TID) | ORAL | 0 refills | Status: DC | PRN

## 2021-09-24 MED ORDER — CELECOXIB 100 MG PO CAPS: 100.0000 mg | ORAL_CAPSULE | Freq: Two times a day (BID) | ORAL | 1 refills | Status: DC

## 2021-09-24 NOTE — Addendum Note (Signed)
Addended by: Rogers Seeds on: 09/24/2021 09:25 AM   Modules accepted: Orders

## 2021-09-24 NOTE — Progress Notes (Signed)
Office Visit Note   Patient: Michael Lowery           Date of Birth: [birth year]           MRN: [id number] Visit Date: 09/24/2021              Requested by: No referring provider defined for this encounter. PCP: No primary care provider on file.   Assessment & Plan: Visit Diagnoses:  1. Strain of lumbar region, initial encounter     Plan: MRI scan is reviewed as well as radiology report reviewed with patient and discussed.  Has some mild degenerative changes we will set him up for physical therapy for a few weeks recheck him in 4 weeks.  Celebrex and Robaxin prescribed.  Recheck 4 weeks and we will discuss work resumption modified versus regular duty at that point.  Work slip given no work x4 weeks.  Follow-Up Instructions: Return in about 4 weeks (around 10/22/2021).   Orders:  No orders of the defined types were placed in this encounter.  Meds ordered this encounter  Medications   celecoxib (CELEBREX) 100 MG capsule    Sig: Take 1 capsule (100 mg total) by mouth 2 (two) times daily.    Dispense:  60 capsule    Refill:  1    Workers comp injury   methocarbamol (ROBAXIN) 500 MG tablet    Sig: Take 1 tablet (500 mg total) by mouth every 8 (eight) hours as needed for muscle spasms.    Dispense:  50 tablet    Refill:  0    Workers comp injury      Procedures: No procedures performed   Clinical Data: No additional findings.   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  He states he was getting out of a truck bed walking around the other side tripped on something felt something in his right low back in the middle of his low back with pain.  Patient states he continue to try to be working but then had increased pain couple days later.  He  drove from [facility] today and states that "just about killed him".  He has had treatment from [facility] who had him on anti-inflammatories, Flexeril, lidocaine injection, prednisone injection, hydrocodone.  He has had an MRI scan that showed some mild degenerative changes without central or foraminal compression.  He is not had any therapy.  He continues to have problems with back pain and has trouble getting upright.  He states he did have problems with the shoulder which was a Teacher, adult education. injury a few years ago and had to have some surgery on his shoulder.  Patient states it was an air hose that he tripped on was wearing protective guards on his knees.  He tried to catch himself on his right knee at the ground he put his arm to the side.  No chills or fever no previous back surgery carb significant problems although he has had back MRI years ago.  Patient is here with 25 pages of work notes, treatment notes medications office visits.  These are reviewed at today's visit and also discussed with rehab nurse.  Michael Register, RN, CCM fax number [phone].  Review of Systems all other systems reviewed noncontributory.   Objective: Vital Signs: BP (!) 141/86   Ht 5\' 9"  (1.753 m)   Wt 175 lb (79.4 kg)   BMI 25.84 kg/m   Physical Exam Constitutional:      Appearance: He is well-developed.  HENT:     Head: Normocephalic and atraumatic.     Right Ear: External ear normal.     Left Ear: External ear normal.  Eyes:     Pupils: Pupils are equal, round, and reactive to light.  Neck:     Thyroid: No thyromegaly.     Trachea: No tracheal deviation.  Cardiovascular:     Rate and Rhythm: Normal rate.  Pulmonary:     Effort: Pulmonary effort is normal.     Breath sounds: No wheezing.  Abdominal:     General: Bowel sounds are normal.     Palpations: Abdomen is soft.  Musculoskeletal:     Cervical back: Neck supple.  Skin:    General: Skin is warm and dry.      Capillary Refill: Capillary refill takes less than 2 seconds.  Neurological:     Mental Status: He is alert and oriented to person, place, and time.  Psychiatric:        Behavior: Behavior normal.        Thought Content: Thought content normal.        Judgment: Judgment normal.    Ortho Exam patient somewhat slow getting present standing walks with a sword flexed position.  With some encouragement he is able to heel and toe walk.  No sciatic notch tenderness he states pressure over the trochanters actually makes him feel better negative logroll hips knee and ankle jerk are intact anterior tib gastrocsoleus is intact palpable pedal pulse no atrophy lower extremities.  No scoliosis pelvis is level.  Specialty Comments:  No specialty comments available.  Imaging: CLINICAL DATA: Low back pain after falling at work  EXAM: MRI LUMBAR SPINE WITHOUT CONTRAST  TECHNIQUE: Multiplanar, multisequence MR imaging of the lumbar spine was performed. No intravenous contrast was administered.  COMPARISON: 06/06/2011  FINDINGS: Segmentation: Standard.  Alignment: Physiologic.  Vertebrae: No fracture, evidence of discitis, or bone lesion.  Conus medullaris and cauda equina: Conus extends to the L1-2 level. Conus and cauda equina appear normal.  Paraspinal and other soft tissues: Negative.  Disc levels:  T12-L1: Small right subarticular disc protrusion. No stenosis.  L1-L2: Normal disc space and facet joints. No spinal canal stenosis. No neural foraminal stenosis.  L2-L3: Small disc bulge with endplate spurring. No spinal canal stenosis. No neural foraminal stenosis.  L3-L4: Normal disc space and facet joints. No spinal canal stenosis. No neural foraminal stenosis.  L4-L5: Normal disc space and facet joints. No spinal canal stenosis. No neural foraminal stenosis.  L5-S1: Disc space narrowing with small bulge. No spinal canal stenosis. No neural foraminal stenosis.  Visualized  sacrum: Normal.  IMPRESSION: Mild lumbar degenerative disc disease without spinal canal or neural foraminal stenosis.   Electronically Signed By: 06/08/2011 M.D. On: 09/04/2021 01:58   PMFS History: Patient Active Problem List   Diagnosis Date Noted   Lumbar strain 09/24/2021    No past medical history on file.  No family history on file.   Social History   Occupational History   Not on file  Tobacco Use   Smoking status: Not on file   Smokeless tobacco: Not on file  Substance and Sexual Activity   Alcohol use: Not  on file   Drug use: Not on file   Sexual activity: Not on file

## 2021-10-23 ENCOUNTER — Encounter: Payer: Self-pay | Admitting: Orthopaedic Surgery

## 2021-10-23 ENCOUNTER — Other Ambulatory Visit: Payer: Self-pay

## 2021-10-23 ENCOUNTER — Ambulatory Visit: Payer: Self-pay | Admitting: Medical

## 2021-10-23 ENCOUNTER — Ambulatory Visit (INDEPENDENT_AMBULATORY_CARE_PROVIDER_SITE_OTHER): Payer: Worker's Compensation | Admitting: Orthopaedic Surgery

## 2021-10-23 VITALS — BP 137/90 | HR 88

## 2021-10-23 DIAGNOSIS — S39012A Strain of muscle, fascia and tendon of lower back, initial encounter: Secondary | ICD-10-CM

## 2021-10-23 MED ORDER — DICLOFENAC SODIUM 75 MG PO TBEC: 75.0000 mg | DELAYED_RELEASE_TABLET | Freq: Two times a day (BID) | ORAL | 1 refills | Status: DC

## 2021-10-23 NOTE — Progress Notes (Signed)
Office Visit Note   Patient: Michael Lowery           Date of Birth: 12-Jul-1971           MRN: [id number] Visit Date: 10/23/2021              Requested by: No referring provider defined for this encounter. PCP: Patient, No Pcp Per (Inactive)   Assessment & Plan: Visit Diagnoses:  1. Strain of lumbar region, initial encounter     Plan: We will set patient up for single epidural and see him back in 4 weeks.  Rehab nurse Joya Martyr MS, CCM present today subbing for the previous rehab nurse.  Lucy's fax number is [phone].  Patient's not gotten good relief with a single epidural then FCE will be required.  Follow-Up Instructions: Return in about 4 weeks (around 11/20/2021).   Orders:  Orders Placed This Encounter  Procedures   Ambulatory referral to Physical Medicine Rehab   Meds ordered this encounter  Medications   diclofenac (VOLTAREN) 75 MG EC tablet    Sig: Take 1 tablet (75 mg total) by mouth 2 (two) times daily.    Dispense:  60 tablet    Refill:  1    W/c case.    stopping Celebrex switching to voltaren.      Procedures: No procedures performed   Clinical Data: No additional findings.   Subjective: Chief Complaint  Patient presents with   Lower Back - Follow-up    HPI 50 year old male returns for follow-up post on-the-job injury 08/13/2021.  Difficult historian with incomplete sentences describing problems.  In standing position he is not unloading his back and extends his knee puts his hand down to his ankle described areas where he sometimes feels pain performing a straight leg raising to 90 degrees.  He states his pain is may be better with therapy its difficult to get a straight answer.  He has been going 3 times a week.  Patient states he is not sure if he is going too much not doing enough.  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  Patient states there is no way he can do even modified work at this  point.  MRI scan 09/04/2021 showed mild degenerative changes of lumbar spine without central, lateral recess or foraminal stenosis.  Patient states he wants to know why he is having such severe pain if his MRI looks okay.  Patient previously treated at [facility] occupational medicine with anti-inflammatories, muscle relaxants, lidocaine injections, prednisone, hydrocodone.  Patient described injury on last visit is tripping over an air hose wearing protective guards on his knees trying to catch himself with back pain.  Review of Systems all other systems noncontributory to HPI.   Objective: Vital Signs: BP 137/90 (BP Location: Left Arm, Patient Position: Sitting, Cuff Size: Large)   Pulse 88   SpO2 97%   Physical Exam Constitutional:      Appearance: He is well-developed.  HENT:     Head: Normocephalic and atraumatic.     Right Ear: External ear normal.     Left Ear: External ear normal.  Eyes:     Pupils: Pupils are equal, round, and reactive to light.  Neck:     Thyroid: No thyromegaly.     Trachea: No tracheal deviation.  Cardiovascular:     Rate and Rhythm: Normal rate.  Pulmonary:     Effort: Pulmonary effort is normal.  Breath sounds: No wheezing.  Abdominal:     General: Bowel sounds are normal.     Palpations: Abdomen is soft.  Musculoskeletal:     Cervical back: Neck supple.  Skin:    General: Skin is warm and dry.     Capillary Refill: Capillary refill takes less than 2 seconds.  Neurological:     Mental Status: He is alert and oriented to person, place, and time.  Psychiatric:        Behavior: Behavior normal.        Thought Content: Thought content normal.        Judgment: Judgment normal.    Ortho Exam anxious and animated with description of symptoms.  Negative straight leg raising 90 degrees with distraction.  He can reach down and touch his toes with knees extended sitting position without problems.  No atrophy.  Negative logroll of the hips knee  and ankle jerk are intact.  He can heel and toe walk with encouragement.  Positive hypersensitivity, positive distraction, positive cogwheel resistance.  Specialty Comments:  No specialty comments available.  Imaging: No results found.   PMFS History: Patient Active Problem List   Diagnosis Date Noted   Lumbar strain 09/24/2021   Past Medical History:  Diagnosis Date   Hypertension     History reviewed. No pertinent family history.  History reviewed. No pertinent surgical history. Social History   Occupational History   Not on file  Tobacco Use   Smoking status: Every Day    Packs/day: 1.00    Types: Cigarettes   Smokeless tobacco: Never  Substance and Sexual Activity   Alcohol use: No   Drug use: No   Sexual activity: Not on file

## 2021-11-07 ENCOUNTER — Encounter: Payer: Self-pay | Admitting: Physical Medicine and Rehabilitation

## 2021-11-07 ENCOUNTER — Ambulatory Visit (INDEPENDENT_AMBULATORY_CARE_PROVIDER_SITE_OTHER): Payer: Worker's Compensation | Admitting: Physical Medicine and Rehabilitation

## 2021-11-07 ENCOUNTER — Ambulatory Visit: Payer: Self-pay

## 2021-11-07 ENCOUNTER — Other Ambulatory Visit: Payer: Self-pay

## 2021-11-07 VITALS — BP 154/93 | HR 99

## 2021-11-07 DIAGNOSIS — M5416 Radiculopathy, lumbar region: Secondary | ICD-10-CM

## 2021-11-07 MED ORDER — METHYLPREDNISOLONE ACETATE 80 MG/ML IJ SUSP: 80.0000 mg | Freq: Once | INTRAMUSCULAR | Status: DC

## 2021-11-07 NOTE — Progress Notes (Signed)
Pt state lower back pain that travels down to buttocks, hip and leg. Pt state walking, standing and laying down makes the pain worse. Pt state he takes over the counter pain meds uses heat to help ease his pain. Re-scheduled due to not having transportation.  Numeric Pain Rating Scale and Functional Assessment Average Pain 7   In the last MONTH (on 0-10 scale) has pain interfered with the following?  1. General activity like being  able to carry out your everyday physical activities such as walking, climbing stairs, carrying groceries, or moving a chair?  Rating(10)   -Driver, -BT, -Dye Allergies.

## 2021-11-14 ENCOUNTER — Encounter: Payer: Self-pay | Admitting: Physical Medicine and Rehabilitation

## 2021-11-14 ENCOUNTER — Ambulatory Visit: Payer: Self-pay

## 2021-11-14 ENCOUNTER — Other Ambulatory Visit: Payer: Self-pay

## 2021-11-14 ENCOUNTER — Ambulatory Visit (INDEPENDENT_AMBULATORY_CARE_PROVIDER_SITE_OTHER): Payer: Worker's Compensation | Admitting: Physical Medicine and Rehabilitation

## 2021-11-14 VITALS — BP 133/80 | HR 92

## 2021-11-14 DIAGNOSIS — M5416 Radiculopathy, lumbar region: Secondary | ICD-10-CM | POA: Diagnosis not present

## 2021-11-14 MED ORDER — METHYLPREDNISOLONE ACETATE 80 MG/ML IJ SUSP
80.0000 mg | Freq: Once | INTRAMUSCULAR | Status: AC
  Administered 2021-11-14: 11:00:00 80 mg

## 2021-11-14 NOTE — Patient Instructions (Signed)

## 2021-11-14 NOTE — Progress Notes (Signed)
Pt state lower back pain that travels down to buttocks, hip and leg. Pt state walking, standing and laying down makes the pain worse. Pt state he takes over the counter pain meds uses heat to help ease his pain.  Numeric Pain Rating Scale and Functional Assessment Average Pain 7   In the last MONTH (on 0-10 scale) has pain interfered with the following?  1. General activity like being  able to carry out your everyday physical activities such as walking, climbing stairs, carrying groceries, or moving a chair?  Rating(10.)   +Driver, -BT, -Dye Allergies.

## 2021-11-22 ENCOUNTER — Encounter: Payer: Self-pay | Admitting: Orthopaedic Surgery

## 2021-11-22 ENCOUNTER — Other Ambulatory Visit: Payer: Self-pay

## 2021-11-22 ENCOUNTER — Ambulatory Visit (INDEPENDENT_AMBULATORY_CARE_PROVIDER_SITE_OTHER): Payer: Worker's Compensation | Admitting: Orthopaedic Surgery

## 2021-11-22 VITALS — BP 147/104 | HR 112 | Ht 69.0 in | Wt 175.0 lb

## 2021-11-22 DIAGNOSIS — S39012A Strain of muscle, fascia and tendon of lower back, initial encounter: Secondary | ICD-10-CM | POA: Diagnosis not present

## 2021-11-22 MED ORDER — DICLOFENAC SODIUM 75 MG PO TBEC: 75.0000 mg | DELAYED_RELEASE_TABLET | Freq: Two times a day (BID) | ORAL | 1 refills | Status: AC

## 2021-11-22 NOTE — Progress Notes (Addendum)
Office Visit Note   Patient: Michael Lowery           Date of Birth: Jul 05, 1971           MRN: [id number] Visit Date: 11/22/2021              Requested by: No referring provider defined for this encounter. PCP: Patient, No Pcp Per (Inactive)   Assessment & Plan: Visit Diagnoses:  1. Strain of lumbar region, initial encounter     Plan: Would recommend proceeding with an FCE and then work resumption activity after review. Rehab nurse Nestor Ramp, RN, CCM present today her fax number [phone]  Follow-Up Instructions: No follow-ups on file.   Orders:  Orders Placed This Encounter  Procedures   Ambulatory referral to Physical Therapy   Meds ordered this encounter  Medications   diclofenac (VOLTAREN) 75 MG EC tablet    Sig: Take 1 tablet (75 mg total) by mouth 2 (two) times daily.    Dispense:  60 tablet    Refill:  1    W/c case.    stopping Celebrex switching to voltaren.      Procedures: No procedures performed   Clinical Data: No additional findings.   Subjective: Chief Complaint  Patient presents with   Lower Back - Follow-up    OTJI 08/13/2021    HPI 50 year old male returns for follow-up on-the-job injury 08/13/2021.  He continues to complain of having symptoms that very.  He states the epidural gave him relief for just a few days with his back pain leg pain.  Patient states "he knows something is not right".  MRI scan has been obtained 09/04/2021 which showed only mild degenerative changes without any areas of compression basically normal for his age. Review of Systems all other systems noncontributory unchanged from previous office exam.   Objective: Vital Signs: BP (!) 147/104    Pulse (!) 112    Ht 5\' 9"  (1.753 m)    Wt 175 lb (79.4 kg)    BMI 25.84 kg/m   Physical Exam Constitutional:      Appearance: He is well-developed.  HENT:     Head: Normocephalic and atraumatic.     Right Ear: External ear normal.     Left Ear: External ear normal.   Eyes:     Pupils: Pupils are equal, round, and reactive to light.  Neck:     Thyroid: No thyromegaly.     Trachea: No tracheal deviation.  Cardiovascular:     Rate and Rhythm: Normal rate.  Pulmonary:     Effort: Pulmonary effort is normal.     Breath sounds: No wheezing.  Abdominal:     General: Bowel sounds are normal.     Palpations: Abdomen is soft.  Musculoskeletal:     Cervical back: Neck supple.  Skin:    General: Skin is warm and dry.     Capillary Refill: Capillary refill takes less than 2 seconds.  Neurological:     Mental Status: He is alert and oriented to person, place, and time.  Psychiatric:        Behavior: Behavior normal.        Thought Content: Thought content normal.        Judgment: Judgment normal.    Ortho Exam patient states when pain radiates down goes his ankle in a sitting position he extends his knee reaches down to his ankle with his hand performing a straight leg raising to demonstrate where the pain  goes.  Inconsistent exam findings with 3-4 out of 5 positive [place].  No atrophy normal reflexes.  Specialty Comments:  No specialty comments available.  Imaging: No results found.   PMFS History: Patient Active Problem List   Diagnosis Date Noted   Lumbar strain 09/24/2021   Past Medical History:  Diagnosis Date   Hypertension     No family history on file.  No past surgical history on file. Social History   Occupational History   Not on file  Tobacco Use   Smoking status: Every Day    Packs/day: 1.00    Types: Cigarettes   Smokeless tobacco: Never  Substance and Sexual Activity   Alcohol use: No   Drug use: No   Sexual activity: Not on file

## 2021-12-17 ENCOUNTER — Ambulatory Visit (INDEPENDENT_AMBULATORY_CARE_PROVIDER_SITE_OTHER): Payer: Worker's Compensation | Admitting: Orthopaedic Surgery

## 2021-12-17 ENCOUNTER — Other Ambulatory Visit: Payer: Self-pay

## 2021-12-17 ENCOUNTER — Encounter: Payer: Self-pay | Admitting: Orthopaedic Surgery

## 2021-12-17 VITALS — BP 123/86 | HR 89 | Ht 69.0 in | Wt 170.0 lb

## 2021-12-17 DIAGNOSIS — S39012S Strain of muscle, fascia and tendon of lower back, sequela: Secondary | ICD-10-CM

## 2021-12-17 DIAGNOSIS — M545 Low back pain, unspecified: Secondary | ICD-10-CM | POA: Diagnosis not present

## 2021-12-17 NOTE — Progress Notes (Signed)
Office Visit Note   Patient: Michael Lowery           Date of Birth: 1971/02/23           MRN: [id number] Visit Date: 12/17/2021              Requested by: No referring provider defined for this encounter. PCP: Patient, No Pcp Per (Inactive)   Assessment & Plan: Visit Diagnoses:  1. Strain of lumbar region, sequela     Plan: Discussed with patient my recommendation would be work resumption with maximal lifting of 40 pounds x 6 weeks.  His FCE was invalid and he had positive findings on exams when I have seen him for several visits in the past nonphysiologic.  Lumbar MRI showed "mild lumbar degenerative disc disease without spinal canal or neuroforaminal stenosis".  Discussed with him and reviewed images with patient with his rehab nurse present that these mild degenerative changes are expected and very common at age 57.  It would be rare for him not to have any mild changes at age 73.  Discussed importance of gradual work resumption.  He discussed long periods of time that he has been working overtime with 10-hour days.  I plan to recheck him in 6 weeks.  Work slip given for work resumption maximum lifting 40 pounds. I do not think at this time that a second FCE would have additional information that would be helpful for his treatment plan.  Follow-Up Instructions: Return in about 6 weeks (around 01/28/2022).   Orders:  No orders of the defined types were placed in this encounter.  No orders of the defined types were placed in this encounter.     Procedures: No procedures performed   Clinical Data: No additional findings.   Subjective: Chief Complaint  Patient presents with   Lower Back - Follow-up    FCE review OTJI 08/13/2021    HPI 51 year old male returns for follow-up Worker's Comp. injury 08/13/2021.  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  He states he works up to 10-hour shifts.  He was in occupational medicine visits with anti-inflammatories  Flexeril prednisone injections, hydrocodone, lidocaine injection.  Patient had past history of addiction and states he has to be careful with medications that have a potential for addiction.  He has been through therapy, MRI scans.  Based on findings on his exam with nonphysiologic findings FCE was obtained.  FCE found invalid performance but he did qualify for medium demands location.  I have received a letter dated January 23 from his attorney ,the patient also brought a copy of the same letter from his attorney requesting another FCE ordered since the one that was done to him use the XRT[employer] testing system.  My previous notes document positive Waddell signs.  Patient is here with his case manager Nestor Ramp, RN, CCM.  Office number [phone].  Fax number [phone].  Patient states some days are better than others he has had increased symptoms last 3 days problems in the morning when he has stiffness soreness.  He states after FCE feels like he has been in a "depressed state".  Patient feels things have been stressful he has had some difficulties sleeping problems getting up in the morning and getting started.  Patient's been on Tylenol PM Tylenol extra strength, diclofenac, heat and ice.    Review of Systems all systems updated noncontributory.   Objective: Vital Signs: BP 123/86    Pulse 89    Ht  5\' 9"  (1.753 m)    Wt 170 lb (77.1 kg)    BMI 25.10 kg/m   Physical Exam Constitutional:      Appearance: He is well-developed.  HENT:     Head: Normocephalic and atraumatic.     Right Ear: External ear normal.     Left Ear: External ear normal.  Eyes:     Pupils: Pupils are equal, round, and reactive to light.  Neck:     Thyroid: No thyromegaly.     Trachea: No tracheal deviation.  Cardiovascular:     Rate and Rhythm: Normal rate.  Pulmonary:     Effort: Pulmonary effort is normal.     Breath sounds: No wheezing.  Abdominal:     General: Bowel sounds are normal.     Palpations:  Abdomen is soft.  Musculoskeletal:     Cervical back: Neck supple.  Skin:    General: Skin is warm and dry.     Capillary Refill: Capillary refill takes less than 2 seconds.  Neurological:     Mental Status: He is alert and oriented to person, place, and time.  Psychiatric:        Behavior: Behavior normal.        Thought Content: Thought content normal.        Judgment: Judgment normal.    Ortho Exam normal heel toe gait.  Patient has wraparound office leaning to one side with his hand on his hip changes positions rapidly getting from sitting standing quickly.  No rash over exposed skin no atrophy.  Specialty Comments:  No specialty comments available.  Imaging: No results found.   PMFS History: Patient Active Problem List   Diagnosis Date Noted   Lumbar strain 09/24/2021   Past Medical History:  Diagnosis Date   Hypertension     No family history on file.  No past surgical history on file. Social History   Occupational History   Not on file  Tobacco Use   Smoking status: Every Day    Packs/day: 1.00    Types: Cigarettes   Smokeless tobacco: Never  Substance and Sexual Activity   Alcohol use: No   Drug use: No   Sexual activity: Not on file

## 2021-12-18 NOTE — Procedures (Signed)
Lumbar Epidural Steroid Injection - Interlaminar Approach with Fluoroscopic Guidance  Patient: Michael Lowery      Date of Birth: 10/01/1971 MRN: [id number] PCP: Patient, No Pcp Per (Inactive)      Visit Date: 11/14/2021   Universal Protocol:     Consent Given By: the patient  Position: PRONE  Additional Comments: Vital signs were monitored before and after the procedure. Patient was prepped and draped in the usual sterile fashion. The correct patient, procedure, and site was verified.   Injection Procedure Details:   Procedure diagnoses: Lumbar radiculopathy [M54.16]   Meds Administered:  Meds ordered this encounter  Medications   methylPREDNISolone acetate (DEPO-MEDROL) injection 80 mg     Laterality: Right  Location/Site:  L5-S1  Needle: 3.5 in., 20 ga. Tuohy  Needle Placement: Paramedian epidural  Findings:   -Comments: Excellent flow of contrast into the epidural space.  Procedure Details: Using a paramedian approach from the side mentioned above, the region overlying the inferior lamina was localized under fluoroscopic visualization and the soft tissues overlying this structure were infiltrated with 4 ml. of 1% Lidocaine without Epinephrine. The Tuohy needle was inserted into the epidural space using a paramedian approach.   The epidural space was localized using loss of resistance along with counter oblique bi-planar fluoroscopic views.  After negative aspirate for air, blood, and CSF, a 2 ml. volume of Isovue-250 was injected into the epidural space and the flow of contrast was observed. Radiographs were obtained for documentation purposes.    The injectate was administered into the level noted above.   Additional Comments:  The patient tolerated the procedure well Dressing: 2 x 2 sterile gauze and Band-Aid    Post-procedure details: Patient was observed during the procedure. Post-procedure instructions were reviewed.  Patient left the clinic in  stable condition.

## 2021-12-18 NOTE — Progress Notes (Signed)
Michael RakeBenjamin Lowery - 51 y.o. male MRN [id number]  Date of birth: 05/09/1971  Office Visit Note: Visit Date: 11/14/2021 PCP: Patient, No Pcp Per (Inactive) Referred by: No ref. provider found  Subjective: Chief Complaint  Patient presents with   Lower Back - Pain   Right Hip - Pain   Right Leg - Pain   HPI:  Michael RakeBenjamin Lowery is a 51 y.o. male who comes in today at the request of Dr. Annell GreeningMark Yates for planned Right L5-S1 Lumbar Interlaminar epidural steroid injection with fluoroscopic guidance.  The patient has failed conservative care including home exercise, medications, time and activity modification.  This injection will be diagnostic and hopefully therapeutic.  Please see requesting physician notes for further details and justification.  ROS Otherwise per HPI.  Assessment & Plan: Visit Diagnoses:    ICD-10-CM   1. Lumbar radiculopathy  M54.16 XR C-ARM NO REPORT    Epidural Steroid injection    methylPREDNISolone acetate (DEPO-MEDROL) injection 80 mg      Plan: No additional findings.   Meds & Orders:  Meds ordered this encounter  Medications   methylPREDNISolone acetate (DEPO-MEDROL) injection 80 mg    Orders Placed This Encounter  Procedures   XR C-ARM NO REPORT   Epidural Steroid injection    Follow-up: Return for Annell GreeningMark Yates, MD.   Procedures: No procedures performed  Lumbar Epidural Steroid Injection - Interlaminar Approach with Fluoroscopic Guidance  Patient: Michael Lowery      Date of Birth: 04/26/1971 MRN: [id number] PCP: Patient, No Pcp Per (Inactive)      Visit Date: 11/14/2021   Universal Protocol:     Consent Given By: the patient  Position: PRONE  Additional Comments: Vital signs were monitored before and after the procedure. Patient was prepped and draped in the usual sterile fashion. The correct patient, procedure, and site was verified.   Injection Procedure Details:   Procedure diagnoses: Lumbar radiculopathy [M54.16]   Meds  Administered:  Meds ordered this encounter  Medications   methylPREDNISolone acetate (DEPO-MEDROL) injection 80 mg     Laterality: Right  Location/Site:  L5-S1  Needle: 3.5 in., 20 ga. Tuohy  Needle Placement: Paramedian epidural  Findings:   -Comments: Excellent flow of contrast into the epidural space.  Procedure Details: Using a paramedian approach from the side mentioned above, the region overlying the inferior lamina was localized under fluoroscopic visualization and the soft tissues overlying this structure were infiltrated with 4 ml. of 1% Lidocaine without Epinephrine. The Tuohy needle was inserted into the epidural space using a paramedian approach.   The epidural space was localized using loss of resistance along with counter oblique bi-planar fluoroscopic views.  After negative aspirate for air, blood, and CSF, a 2 ml. volume of Isovue-250 was injected into the epidural space and the flow of contrast was observed. Radiographs were obtained for documentation purposes.    The injectate was administered into the level noted above.   Additional Comments:  The patient tolerated the procedure well Dressing: 2 x 2 sterile gauze and Band-Aid    Post-procedure details: Patient was observed during the procedure. Post-procedure instructions were reviewed.  Patient left the clinic in stable condition.    Clinical History: No specialty comments available.     Objective:  VS:  HT:     WT:    BMI:      BP:133/80   HR:92bpm   TEMP: ( )   RESP:  Physical Exam Vitals and nursing note reviewed.  Constitutional:  General: He is not in acute distress.    Appearance: Normal appearance. He is not ill-appearing.  HENT:     Head: Normocephalic and atraumatic.     Right Ear: External ear normal.     Left Ear: External ear normal.     Nose: No congestion.  Eyes:     Extraocular Movements: Extraocular movements intact.  Cardiovascular:     Rate and Rhythm: Normal rate.      Pulses: Normal pulses.  Pulmonary:     Effort: Pulmonary effort is normal. No respiratory distress.  Abdominal:     General: There is no distension.     Palpations: Abdomen is soft.  Musculoskeletal:        General: No tenderness or signs of injury.     Cervical back: Neck supple.     Right lower leg: No edema.     Left lower leg: No edema.     Comments: Patient has good distal strength without clonus.  Skin:    Findings: No erythema or rash.  Neurological:     General: No focal deficit present.     Mental Status: He is alert and oriented to person, place, and time.     Sensory: No sensory deficit.     Motor: No weakness or abnormal muscle tone.     Coordination: Coordination normal.  Psychiatric:        Mood and Affect: Mood normal.        Behavior: Behavior normal.     Imaging: No results found.

## 2022-02-04 ENCOUNTER — Other Ambulatory Visit: Payer: Self-pay

## 2022-02-04 ENCOUNTER — Ambulatory Visit (INDEPENDENT_AMBULATORY_CARE_PROVIDER_SITE_OTHER): Payer: Worker's Compensation | Admitting: Orthopaedic Surgery

## 2022-02-04 DIAGNOSIS — S39012S Strain of muscle, fascia and tendon of lower back, sequela: Secondary | ICD-10-CM

## 2022-02-04 NOTE — Progress Notes (Signed)
? ?Office Visit Note ?  ?Patient: Michael Lowery           ?Date of Birth: 08/25/1971           ?MRN: [id number] ?Visit Date: 02/04/2022 ?             ?Requested by: No referring provider defined for this encounter. ?PCP: Patient, No Pcp Per (Inactive) ? ? ?Assessment & Plan: ?Visit Diagnoses:  ?1. Strain of lumbar region, sequela   ? ? ?Plan: Patient had an FCE that found he was acceptable for medium demand work activity I have previously given him a note.  I will release him impairment rating is 2% of the lumbar spine.  Unfortunately his FCE was invalid and indeterminant on what his maximum work capacity was at that time.  I have encouraged him for look for work employment with activities that he feels comfortable doing.  Patient is at [employer] he is rated and released.  Alric Quan, RN, CCM was present today. ? ?Follow-Up Instructions: Return if symptoms worsen or fail to improve.  ? ?Orders:  ?No orders of the defined types were placed in this encounter. ? ?No orders of the defined types were placed in this encounter. ? ? ? ? Procedures: ?No procedures performed ? ? ?Clinical Data: ?No additional findings. ? ? ?Subjective: ?Chief Complaint  ?Patient presents with  ? Lower Back - Pain, Follow-up  ? ? ?HPI patient returns for 6 weeks follow-up.  He had an FCE done in January which showed invalid results.  Previous Waddell's findings on physical exam when I had seen him.  He states he had increased symptoms on the 16th and 17th in February.  He states he done some yard work using the blower that bothered him.  MRI scan showed minimal degenerative changes no specific areas of central lateral recess or foraminal compression.  I had released him for work resumption maximum lifting 40 pounds.  Patient's been helping take care of his mother. ? ?Review of Systems negative for chills or fever updated unchanged. ? ? ?Objective: ?Vital Signs: There were no vitals taken for this visit. ? ?Physical Exam ?Constitutional:   ?    Appearance: He is well-developed.  ?HENT:  ?   Head: Normocephalic and atraumatic.  ?   Right Ear: External ear normal.  ?   Left Ear: External ear normal.  ?Eyes:  ?   Pupils: Pupils are equal, round, and reactive to light.  ?Neck:  ?   Thyroid: No thyromegaly.  ?   Trachea: No tracheal deviation.  ?Cardiovascular:  ?   Rate and Rhythm: Normal rate.  ?Pulmonary:  ?   Effort: Pulmonary effort is normal.  ?   Breath sounds: No wheezing.  ?Abdominal:  ?   General: Bowel sounds are normal.  ?   Palpations: Abdomen is soft.  ?Musculoskeletal:  ?   Cervical back: Neck supple.  ?Skin: ?   General: Skin is warm and dry.  ?   Capillary Refill: Capillary refill takes less than 2 seconds.  ?Neurological:  ?   Mental Status: He is alert and oriented to person, place, and time.  ?Psychiatric:     ?   Behavior: Behavior normal.     ?   Thought Content: Thought content normal.     ?   Judgment: Judgment normal.  ? ? ?Ortho Exam patient is amatory no rash of exposed skin.  Sitting standing in the normal fashion. ? ?Specialty Comments:  ?No specialty  comments available. ? ?Imaging: ? ?Narrative & Impression  ?CLINICAL DATA:  Low back pain after falling at work ?  ?EXAM: ?MRI LUMBAR SPINE WITHOUT CONTRAST ?  ?TECHNIQUE: ?Multiplanar, multisequence MR imaging of the lumbar spine was ?performed. No intravenous contrast was administered. ?  ?COMPARISON:  06/06/2011 ?  ?FINDINGS: ?Segmentation:  Standard. ?  ?Alignment:  Physiologic. ?  ?Vertebrae:  No fracture, evidence of discitis, or bone lesion. ?  ?Conus medullaris and cauda equina: Conus extends to the L1-2 level. ?Conus and cauda equina appear normal. ?  ?Paraspinal and other soft tissues: Negative. ?  ?Disc levels: ?  ?T12-L1: Small right subarticular disc protrusion.  No stenosis. ?  ?L1-L2: Normal disc space and facet joints. No spinal canal stenosis. ?No neural foraminal stenosis. ?  ?L2-L3: Small disc bulge with endplate spurring. No spinal canal ?stenosis. No neural  foraminal stenosis. ?  ?L3-L4: Normal disc space and facet joints. No spinal canal stenosis. ?No neural foraminal stenosis. ?  ?L4-L5: Normal disc space and facet joints. No spinal canal stenosis. ?No neural foraminal stenosis. ?  ?L5-S1: Disc space narrowing with small bulge. No spinal canal ?stenosis. No neural foraminal stenosis. ?  ?Visualized sacrum: Normal. ?  ?IMPRESSION: ?Mild lumbar degenerative disc disease without spinal canal or neural ?foraminal stenosis. ?  ?  ?Electronically Signed ?  By: Ulyses Jarred M.D. ?  On: 09/04/2021 01:58 ?   ? ? ? ?PMFS History: ?Patient Active Problem List  ? Diagnosis Date Noted  ? Lumbar strain 09/24/2021  ? ?Past Medical History:  ?Diagnosis Date  ? Hypertension   ?  ?No family history on file.  ?No past surgical history on file. ?Social History  ? ?Occupational History  ? Not on file  ?Tobacco Use  ? Smoking status: Every Day  ?  Packs/day: 1.00  ?  Types: Cigarettes  ? Smokeless tobacco: Never  ?Substance and Sexual Activity  ? Alcohol use: No  ? Drug use: No  ? Sexual activity: Not on file  ? ? ? ? ? ? ?

## 2022-02-10 ENCOUNTER — Telehealth: Payer: Self-pay | Admitting: Radiology

## 2022-02-10 NOTE — Telephone Encounter (Signed)
Please see below and advise. Previous work note was for no lifting greater than 40lbs x 6 weeks. He has now had FCE.  Continue these restrictions? Work comp [employer] requesting new work note. ? ? ?From: [email or web address]  ?Sent: Tuesday, February 04, 2022 3:16 PM  ?To: [email or web address]  ?Subject: RE: Michael Lowery DOI: 08-13-21 Re: Back CLAIM [id number] Re: MR -CD request  ?   ? ?I need your help.  ?   ?His Appt was today and he ended up walking out before I was called back for consult.  I asked to speak with Dr Ophelia Charter and was able to do so. I know the work status, but never got a new note.  ? ? ?Can you see if one can be done for today and send it to me?   ?

## 2022-02-27 ENCOUNTER — Telehealth: Payer: Self-pay | Admitting: Orthopaedic Surgery

## 2022-02-27 NOTE — Telephone Encounter (Signed)
Aurther Loft (case manager from [employer]) called requesting pt needs to pick up copies of x rays on a disk. Please Aurther Loft when ready for pick at [phone] ?

## 2022-02-27 NOTE — Telephone Encounter (Signed)
Called and spoke to patients case manager Aurther Loft, advised her that the patient has not had any x-rays done here in our office for me to copy.  ?

## 2022-04-03 ENCOUNTER — Emergency Department (HOSPITAL_BASED_OUTPATIENT_CLINIC_OR_DEPARTMENT_OTHER): Payer: Self-pay

## 2022-04-03 ENCOUNTER — Emergency Department (HOSPITAL_BASED_OUTPATIENT_CLINIC_OR_DEPARTMENT_OTHER)
Admission: EM | Admit: 2022-04-03 | Discharge: 2022-04-03 | Disposition: A | Discharge status: Home / Self Care | Payer: Self-pay | Attending: Emergency Medicine | Admitting: Emergency Medicine

## 2022-04-03 ENCOUNTER — Other Ambulatory Visit: Payer: Self-pay

## 2022-04-03 DIAGNOSIS — R030 Elevated blood-pressure reading, without diagnosis of hypertension: Secondary | ICD-10-CM | POA: Insufficient documentation

## 2022-04-03 DIAGNOSIS — R0789 Other chest pain: Secondary | ICD-10-CM

## 2022-04-03 LAB — CBC
HCT: 44.6 % (ref 39.0–52.0)
Hemoglobin: 15.1 g/dL (ref 13.0–17.0)
MCH: 30.2 pg (ref 26.0–34.0)
MCHC: 33.9 g/dL (ref 30.0–36.0)
MCV: 89.2 fL (ref 80.0–100.0)
Platelets: 223 10*3/uL (ref 150–400)
RBC: 5 MIL/uL (ref 4.22–5.81)
RDW: 13.8 % (ref 11.5–15.5)
WBC: 6.1 10*3/uL (ref 4.0–10.5)
nRBC: 0 % (ref 0.0–0.2)

## 2022-04-03 LAB — BASIC METABOLIC PANEL
Anion gap: 7 (ref 5–15)
BUN: 10 mg/dL (ref 6–20)
CO2: 25 mmol/L (ref 22–32)
Calcium: 8.9 mg/dL (ref 8.9–10.3)
Chloride: 105 mmol/L (ref 98–111)
Creatinine, Ser: 1.11 mg/dL (ref 0.61–1.24)
GFR, Estimated: >60 mL/min (ref >60)
Glucose, Bld: 94 mg/dL (ref 70–99)
Potassium: 4.1 mmol/L (ref 3.5–5.1)
Sodium: 137 mmol/L (ref 135–145)

## 2022-04-03 LAB — TROPONIN I (HIGH SENSITIVITY): Troponin I (High Sensitivity): 7 ng/L (ref <18)
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
## 2022-04-03 NOTE — ED Triage Notes (Signed)
Patient c/o hypertension x yesterday. Patient state he has had chest pain x multiple months but has not come to get checked out.  ?

## 2022-04-03 NOTE — ED Notes (Signed)
Dc instructions reviewed with pt no questions or concerns at this time. Will follow up with PCP  ?

## 2022-04-03 NOTE — Discharge Instructions (Signed)
Please read and follow all provided instructions. ? ?Your diagnoses today include:  ?1. Chest discomfort   ?2. Elevated blood pressure reading   ? ? ?Tests performed today include: ?An EKG of your heart ?A chest x-ray ?Cardiac enzymes - a blood test for heart muscle damage ?Blood counts and electrolytes ?Vital signs. See below for your results today.  ? ?Medications prescribed:  ?None ? ?Take any prescribed medications only as directed. ? ?Follow-up instructions: ?Please follow-up with your primary care provider as soon as you can for further evaluation of your symptoms.  ? ?Return instructions:  ?SEEK IMMEDIATE MEDICAL ATTENTION IF: ?You have severe chest pain, especially if the pain is crushing or pressure-like and spreads to the arms, back, neck, or jaw, or if you have sweating, nausea or vomiting, or trouble with breathing. THIS IS AN EMERGENCY. Do not wait to see if the pain will go away. Get medical help at once. Call 911. DO NOT drive yourself to the hospital.  ?Your chest pain gets worse and does not go away after a few minutes of rest.  ?You have an attack of chest pain lasting longer than what you usually experience.  ?You have significant dizziness, if you pass out, or have trouble walking.  ?You have chest pain not typical of your usual pain for which you originally saw your caregiver.  ?You have any other emergent concerns regarding your health. ? ?Additional Information: ?Chest pain comes from many different causes. Your caregiver has diagnosed you as having chest pain that is not specific for one problem, but does not require admission.  You are at low risk for an acute heart condition or other serious illness.  ? ?Your vital signs today were: ?BP (!) 128/93   Pulse 67   Temp 98 ?F (36.7 ?C) (Oral)   Resp 19   Ht 5\' 9"  (1.753 m)   Wt 79.4 kg   SpO2 99%   BMI 25.84 kg/m?  ?If your blood pressure (BP) was elevated above 135/85 this visit, please have this repeated by your doctor within one  month. ?-------------- ? ? ?

## 2022-04-03 NOTE — ED Notes (Signed)
Pt. Here due to he feels like he has been suffering from HTN and wants to get it checked out.  Pt. In no distress and reports he feels no chest pain.  Pt. Does report he thinks his vision has gotten worse and reports no recent visits to his eye Dr.  Rock Nephew. Does wear glasses. ?
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
